# Patient Record
Sex: Female | Born: 1970 | Race: Black or African American | Hispanic: No | Marital: Married | Smoking: Never smoker
Health system: Southern US, Community
[De-identification: ages and names within clinical notes are randomized; demographics above are authoritative.]

## PROBLEM LIST (undated history)

## (undated) DIAGNOSIS — I10 Essential (primary) hypertension: Secondary | ICD-10-CM

## (undated) DIAGNOSIS — M5416 Radiculopathy, lumbar region: Secondary | ICD-10-CM

## (undated) HISTORY — PX: TONSILLECTOMY: SUR1361

## (undated) HISTORY — PX: ABDOMINAL HYSTERECTOMY: SHX81

---

## 2009-02-09 ENCOUNTER — Emergency Department (HOSPITAL_BASED_OUTPATIENT_CLINIC_OR_DEPARTMENT_OTHER): Admission: EM | Admit: 2009-02-09 | Discharge: 2009-02-09 | Payer: Self-pay | Admitting: Emergency Medicine

## 2009-02-09 ENCOUNTER — Ambulatory Visit: Payer: Self-pay | Admitting: Diagnostic Radiology

## 2009-07-25 ENCOUNTER — Emergency Department (HOSPITAL_BASED_OUTPATIENT_CLINIC_OR_DEPARTMENT_OTHER): Admission: EM | Admit: 2009-07-25 | Discharge: 2009-07-25 | Payer: Self-pay | Admitting: Emergency Medicine

## 2009-07-25 ENCOUNTER — Ambulatory Visit: Payer: Self-pay | Admitting: Radiology

## 2009-11-29 ENCOUNTER — Emergency Department (HOSPITAL_BASED_OUTPATIENT_CLINIC_OR_DEPARTMENT_OTHER): Admission: EM | Admit: 2009-11-29 | Discharge: 2009-11-29 | Payer: Self-pay | Admitting: Emergency Medicine

## 2010-07-29 LAB — DIFFERENTIAL
Basophils Absolute: 0.2 10*3/uL — ABNORMAL HIGH (ref 0.0–0.1)
Basophils Relative: 3 % — ABNORMAL HIGH (ref 0–1)
Lymphs Abs: 2 10*3/uL (ref 0.7–4.0)
Monocytes Relative: 6 % (ref 3–12)
Neutro Abs: 3.9 10*3/uL (ref 1.7–7.7)
Neutrophils Relative %: 60 % (ref 43–77)

## 2010-07-29 LAB — BASIC METABOLIC PANEL
BUN: 7 mg/dL (ref 6–23)
Calcium: 8.9 mg/dL (ref 8.4–10.5)
Creatinine, Ser: 0.7 mg/dL (ref 0.4–1.2)

## 2010-07-29 LAB — CBC
HCT: 37.3 % (ref 36.0–46.0)
MCV: 88.2 fL (ref 78.0–100.0)
RBC: 4.23 MIL/uL (ref 3.87–5.11)
WBC: 6.6 10*3/uL (ref 4.0–10.5)

## 2010-07-29 LAB — URINALYSIS, ROUTINE W REFLEX MICROSCOPIC
Bilirubin Urine: NEGATIVE
Glucose, UA: NEGATIVE mg/dL
Hgb urine dipstick: NEGATIVE
pH: 7 (ref 5.0–8.0)

## 2010-08-08 LAB — DIFFERENTIAL
Basophils Relative: 2 % — ABNORMAL HIGH (ref 0–1)
Eosinophils Relative: 1 % (ref 0–5)
Lymphocytes Relative: 43 % (ref 12–46)
Monocytes Absolute: 0.4 10*3/uL (ref 0.1–1.0)
Monocytes Relative: 5 % (ref 3–12)

## 2010-08-08 LAB — COMPREHENSIVE METABOLIC PANEL
AST: 26 U/L (ref 0–37)
Albumin: 4.5 g/dL (ref 3.5–5.2)
CO2: 28 mEq/L (ref 19–32)
Calcium: 9.2 mg/dL (ref 8.4–10.5)
Chloride: 105 mEq/L (ref 96–112)
GFR calc non Af Amer: >60 mL/min (ref >60)
Glucose, Bld: 93 mg/dL (ref 70–99)
Potassium: 3.9 mEq/L (ref 3.5–5.1)
Sodium: 141 mEq/L (ref 135–145)
Total Bilirubin: 0.4 mg/dL (ref 0.3–1.2)

## 2010-08-08 LAB — URINALYSIS, ROUTINE W REFLEX MICROSCOPIC
Bilirubin Urine: NEGATIVE
Ketones, ur: NEGATIVE mg/dL
pH: 7 (ref 5.0–8.0)

## 2010-08-08 LAB — PREGNANCY, URINE: Preg Test, Ur: NEGATIVE

## 2010-08-08 LAB — CBC: Platelets: 236 10*3/uL (ref 150–400)

## 2010-12-15 ENCOUNTER — Emergency Department (HOSPITAL_BASED_OUTPATIENT_CLINIC_OR_DEPARTMENT_OTHER)
Admission: EM | Admit: 2010-12-15 | Discharge: 2010-12-15 | Disposition: A | Discharge status: Home / Self Care | Payer: BC Managed Care – PPO | Attending: Emergency Medicine | Admitting: Emergency Medicine

## 2010-12-15 ENCOUNTER — Encounter: Payer: Self-pay | Admitting: Emergency Medicine

## 2010-12-15 ENCOUNTER — Emergency Department (INDEPENDENT_AMBULATORY_CARE_PROVIDER_SITE_OTHER): Payer: BC Managed Care – PPO

## 2010-12-15 DIAGNOSIS — M7710 Lateral epicondylitis, unspecified elbow: Secondary | ICD-10-CM | POA: Insufficient documentation

## 2010-12-15 DIAGNOSIS — M25529 Pain in unspecified elbow: Secondary | ICD-10-CM

## 2010-12-15 DIAGNOSIS — M7989 Other specified soft tissue disorders: Secondary | ICD-10-CM

## 2010-12-15 DIAGNOSIS — M25429 Effusion, unspecified elbow: Secondary | ICD-10-CM | POA: Insufficient documentation

## 2010-12-15 MED ORDER — CELECOXIB 400 MG PO CAPS: 400.0000 mg | ORAL_CAPSULE | Freq: Two times a day (BID) | ORAL | Status: AC

## 2010-12-15 MED ORDER — HYDROCODONE-ACETAMINOPHEN 5-325 MG PO TABS: 1.0000 | ORAL_TABLET | ORAL | Status: AC | PRN

## 2010-12-15 NOTE — ED Notes (Addendum)
Right elbow pain with swelling since yesterday.  No known injury.  Pt also states she has a knot on her elbow.  Noted soft tissue swelling under skin which is movable.  Right elbow significantly swollen and painful.  Pt denies fever, but states she has been really tired.

## 2010-12-15 NOTE — ED Provider Notes (Signed)
History     CSN: [id number] Arrival date & time: 12/15/2010  1:41 PM  Chief Complaint  Patient presents with  . Joint Pain  . Joint Swelling   The history is provided by the patient.  She had onset yesterday of severe, sharp pains in the right elbow, worse with movement. Pain is rated at 9/10. No numbness, no weakness. She has tried ice and Ibuprofen with no relief. She works at a [employer] with repetitive motion, and has a history of "Tennis Elbow".  Past Medical History  Diagnosis Date  . Migraine     Past Surgical History  Procedure Date  . Abdominal hysterectomy   . Tonsillectomy     History reviewed. No pertinent family history.  History  Substance Use Topics  . Smoking status: Never Smoker   . Smokeless tobacco: Not on file  . Alcohol Use: No    OB History    Grav Para Term Preterm Abortions TAB SAB Ect Mult Living                  Review of Systems  All other systems reviewed and are negative.    Physical Exam  BP 142/88  Pulse 69  Temp(Src) 98.6 F (37 C) (Oral)  Resp 18  SpO2 100%  Physical Exam  Nursing note and vitals reviewed. Constitutional: She is oriented to person, place, and time. She appears well-developed and well-nourished. No distress.  HENT:  Head: Normocephalic and atraumatic.  Right Ear: External ear normal.  Left Ear: External ear normal.  Nose: Nose normal.  Mouth/Throat: Oropharynx is clear and moist.  Eyes: Conjunctivae and EOM are normal. Pupils are equal, round, and reactive to light. No scleral icterus.  Neck: Normal range of motion. Neck supple. No JVD present.  Cardiovascular: Normal rate, regular rhythm and intact distal pulses.   No murmur heard. Pulmonary/Chest: Effort normal and breath sounds normal. She has no wheezes. She has no rales. She exhibits no tenderness.  Abdominal: Soft. Bowel sounds are normal. She exhibits no mass. There is no tenderness.  Musculoskeletal: Normal range of motion. She exhibits no  edema.       Moderate tenderness of the right elbow at the olecranon and lateral epicondyle. There is pain with passive ROM, but full passive ROM is present. Neurovascular is intact. No swelling, warmth or deformity.  Lymphadenopathy:    She has no cervical adenopathy.  Neurological: She is alert and oriented to person, place, and time. No cranial nerve deficit. Coordination normal.  Skin: Skin is warm and dry. No rash noted.  Psychiatric: She has a normal mood and affect.    ED Course  Procedures  MDM Probable lateral epicondylitis.  She states she has difficulty with NSAID's because of GERD. Will try Celebrex. Labs and x-rays reviewed, images viewed by me. Results for orders placed during the hospital encounter of 07/25/09  URINALYSIS, ROUTINE W REFLEX MICROSCOPIC      Component Value Range   Color, Urine YELLOW  YELLOW    Appearance CLOUDY (*) CLEAR    Specific Gravity, Urine 1.017  1.005 - 1.030    pH 7.0  5.0 - 8.0    Glucose, UA NEGATIVE  NEGATIVE (mg/dL)   Hgb urine dipstick NEGATIVE  NEGATIVE    Bilirubin Urine NEGATIVE  NEGATIVE    Ketones, ur NEGATIVE  NEGATIVE (mg/dL)   Protein, ur NEGATIVE  NEGATIVE (mg/dL)   Urobilinogen, UA 0.2  0.0 - 1.0 (mg/dL)   Nitrite NEGATIVE  NEGATIVE    Leukocytes, UA    NEGATIVE    Value: NEGATIVE MICROSCOPIC NOT DONE ON URINES WITH NEGATIVE PROTEIN, BLOOD, LEUKOCYTES, NITRITE, OR GLUCOSE <1000 mg/dL.  BASIC METABOLIC PANEL      Component Value Range   Sodium 141  135 - 145 (mEq/L)   Potassium 4.8  3.5 - 5.1 (mEq/L)   Chloride 106  96 - 112 (mEq/L)   CO2 25  19 - 32 (mEq/L)   Glucose, Bld 87  70 - 99 (mg/dL)   BUN 7  6 - 23 (mg/dL)   Creatinine, Ser .7  0.4 - 1.2 (mg/dL)   Calcium 8.9  8.4 - 40.9 (mg/dL)   GFR calc non Af Amer >60  >60 (mL/min)   GFR calc Af Amer    >60 (mL/min)   Value: >60            The eGFR has been calculated     using the MDRD equation.     This calculation has not been     validated in all clinical      situations.     eGFR's persistently     <60 mL/min signify     possible Chronic Kidney Disease.  CBC      Component Value Range   WBC 6.6  4.0 - 10.5 (K/uL)   RBC 4.23  3.87 - 5.11 (MIL/uL)   Hemoglobin 12.6  12.0 - 15.0 (g/dL)   HCT 81.1  91.4 - 78.2 (%)   MCV 88.2  78.0 - 100.0 (fL)   MCHC 33.8  30.0 - 36.0 (g/dL)   RDW 95.6  21.3 - 08.6 (%)   Platelets 219  150 - 400 (K/uL)  DIFFERENTIAL      Component Value Range   Neutrophils Relative 60  43 - 77 (%)   Neutro Abs 3.9  1.7 - 7.7 (K/uL)   Lymphocytes Relative 31  12 - 46 (%)   Lymphs Abs 2.0  0.7 - 4.0 (K/uL)   Monocytes Relative 6  3 - 12 (%)   Monocytes Absolute 0.4  0.1 - 1.0 (K/uL)   Eosinophils Relative 1  0 - 5 (%)   Eosinophils Absolute 0.1  0.0 - 0.7 (K/uL)   Basophils Relative 3 (*) 0 - 1 (%)   Basophils Absolute 0.2 (*) 0.0 - 0.1 (K/uL)   Dg Elbow Complete Right  12/15/2010  *RADIOLOGY REPORT*  Clinical Data: Pain and swelling without trauma  RIGHT ELBOW - COMPLETE 3+ VIEW  Comparison: None.  Findings: No effusion. Negative for fracture, dislocation, or other acute abnormality.  Normal alignment and mineralization. No significant degenerative change.  Regional soft tissues unremarkable.  IMPRESSION:  Negative  Original Report Authenticated By: Osa Craver, M.D.      Dione Booze, MD 12/15/10 [phone]

## 2010-12-16 ENCOUNTER — Encounter: Payer: Self-pay | Admitting: Family Medicine

## 2010-12-16 ENCOUNTER — Ambulatory Visit (INDEPENDENT_AMBULATORY_CARE_PROVIDER_SITE_OTHER): Payer: BC Managed Care – PPO | Admitting: Family Medicine

## 2010-12-16 VITALS — BP 134/89 | HR 71 | Temp 98.1°F | Ht 61.0 in | Wt 162.8 lb

## 2010-12-16 DIAGNOSIS — M25521 Pain in right elbow: Secondary | ICD-10-CM

## 2010-12-16 DIAGNOSIS — M25529 Pain in unspecified elbow: Secondary | ICD-10-CM

## 2010-12-16 NOTE — Patient Instructions (Signed)
You have lateral epicondylitis Try to avoid painful activities as much as possible. Wear the cock-up wrist splint as much as possible also - can take off to shower, ice, do exercises. Ice the area 3-4 times a day for 15 minutes at a time. Celecoxib daily for pain and inflammation. Ok for you to take the hydrocodone the emergency department gave you at bedtime - no driving on this medicine. Start physical therapy to learn home exercises and stretches and do these every day. Consider formal PT. Consider injection for short term pain relief if the above is not helping. Surgery is a consideration if exercises and injection are not providing enough relief. Follow up with me in 1 month for a recheck.

## 2010-12-17 ENCOUNTER — Encounter: Payer: Self-pay | Admitting: Family Medicine

## 2010-12-17 DIAGNOSIS — M25521 Pain in right elbow: Secondary | ICD-10-CM | POA: Insufficient documentation

## 2010-12-17 NOTE — Assessment & Plan Note (Signed)
Right lateral epicondylitis - counterforce brace causing more pain and was placed in proper location - discontinue this.  NSAIDs, icing, start physical therapy and home exercises.  Cock-up wrist brace to prevent extension of wrist which contributes to overuse at elbow.  Offered injection but she declined - would like to try PT first for next month.  She will return to work next Monday.  May have an element of radial tunnel as well with this.  Her pain at triceps and olecranon may be the result of overuse of elbow extension with pain in this location as well.

## 2010-12-17 NOTE — Progress Notes (Signed)
Subjective:    Patient ID: Nina Patel, female    DOB: [birth year], 40 y.o.   MRN: [id number]  [facility]P: [facility]  HPI 40 yo F here for right arm pain.  Patient reports no known injury. She works at a job folding socks, lots of manual dexterity involved. While working started to develop pain on outside of right elbow. No swelling or bruising. Had similar problem about 2 months ago that seemed to get better - was diagnosed with tennis elbow. Has been given a counterforce brace but this makes it hurt worse Has not tried PT or any home exercises. Is icing and taking motrin. Hard to straighten elbow out due to pain. Some numbness intermittently into tips of 3rd-5th fingers. Has been written out of work by ED through next Monday. Right handed  Past Medical History  Diagnosis Date  . Migraine     Current Outpatient Prescriptions on File Prior to Visit  Medication Sig Dispense Refill  . celecoxib (CELEBREX) 400 MG capsule Take 1 capsule (400 mg total) by mouth 2 (two) times daily.  30 capsule  0  . HYDROcodone-acetaminophen (NORCO) 5-325 MG per tablet Take 1 tablet by mouth every 4 (four) hours as needed for pain.  20 tablet  0    Past Surgical History  Procedure Date  . Abdominal hysterectomy   . Tonsillectomy     No Known Allergies  History   Social History  . Marital Status: Married    Spouse Name: N/A    Number of Children: N/A  . Years of Education: N/A   Occupational History  . Not on file.   Social History Main Topics  . Smoking status: Never Smoker   . Smokeless tobacco: Not on file  . Alcohol Use: No  . Drug Use: No  . Sexually Active:    Other Topics Concern  . Not on file   Social History Narrative  . No narrative on file    Family History  Problem Relation Age of Onset  . Diabetes Mother   . Diabetes Sister   . Hypertension Brother   . Diabetes Maternal Uncle   . Hypertension Maternal Uncle   . Sudden death Paternal Aunt   .  Diabetes Maternal Grandfather   . Heart attack Paternal Grandfather   . Hyperlipidemia Neg Hx     BP 134/89  Pulse 71  Temp(Src) 98.1 F (36.7 C) (Oral)  Ht 5\' 1"  (1.549 m)  Wt 162 lb 12.8 oz (73.846 kg)  BMI 30.76 kg/m2  Review of Systems See HPI above.    Objective:   Physical Exam Gen: NAD R elbow: No gross deformity, swelling, bruising. TTP greatest at lateral epicondyle but also within extensor mass.  Mild TTP olecranon and triceps tendon.  No medial epicondyle, wrist/hand TTP. FROM elbow and wrist. Pain reproduced on wrist extension and 3rd finger extension.  Strength diminished 2/2 pain. Collateral ligaments intact NVI distally. Sensation currently intact.    Assessment & Plan:  1. Right lateral epicondylitis - counterforce brace causing more pain and was placed in proper location - discontinue this.  NSAIDs, icing, start physical therapy and home exercises.  Cock-up wrist brace to prevent extension of wrist which contributes to overuse at elbow.  Offered injection but she declined - would like to try PT first for next month.  She will return to work next Monday.  May have an element of radial tunnel as well with this.  Her pain at triceps and  olecranon may be the result of overuse of elbow extension with pain in this location as well.

## 2010-12-23 ENCOUNTER — Ambulatory Visit: Payer: BC Managed Care – PPO | Attending: Family Medicine | Admitting: Physical Therapy

## 2010-12-26 ENCOUNTER — Encounter: Payer: Self-pay | Admitting: Family Medicine

## 2010-12-26 ENCOUNTER — Ambulatory Visit (INDEPENDENT_AMBULATORY_CARE_PROVIDER_SITE_OTHER): Payer: BC Managed Care – PPO | Admitting: Family Medicine

## 2010-12-26 VITALS — BP 110/75 | HR 77 | Temp 98.3°F | Ht 61.0 in | Wt 162.0 lb

## 2010-12-26 DIAGNOSIS — M25521 Pain in right elbow: Secondary | ICD-10-CM

## 2010-12-26 DIAGNOSIS — M25529 Pain in unspecified elbow: Secondary | ICD-10-CM

## 2010-12-26 NOTE — Assessment & Plan Note (Signed)
Right lateral epicondylitis - exam is consistent with lateral epicondylitis.  Has not yet started PT so not surprising she has not noticed much improvement over past 10 days.  Continue wearing her wrist brace, icing, will try aleve instead of celebrex and be sure to take with food.  Start PT and home exercises - stressed she needs to do these every day.  She would like to hold off on cortisone injection and give PT a try first which I believe is the right course.  If not improving at follow-up in 1 month would go ahead with injection though.  Place on light duty in a job where she doesn't have to do repetitive wrist motions (especially extension) that will further exacerbate this.

## 2010-12-26 NOTE — Progress Notes (Signed)
Subjective:    Patient ID: Nina Patel, female    DOB: 1970-09-18, 40 y.o.   MRN: [id number]  PCP: [facility]  HPI  40 yo F here for f/u right lateral epicondylitis.  8/13: Patient reports no known injury. She works at a job folding socks, lots of manual dexterity involved. While working started to develop pain on outside of right elbow. No swelling or bruising. Had similar problem about 2 months ago that seemed to get better - was diagnosed with tennis elbow. Has been given a counterforce brace but this makes it hurt worse Has not tried PT or any home exercises. Is icing and taking motrin. Hard to straighten elbow out due to pain. Some numbness intermittently into tips of 3rd-5th fingers. Has been written out of work by ED through next Monday. Right handed  8/23: Patient has been wearing wrist splint, icing, but has not yet started PT (due to start next Thursday). She takes celebrex also but this makes her nauseated. Back at work wearing splint also but had to leave early yesterday due to pain. Majority of pain is at lateral right elbow, now getting some in dorsal wrist with extension of wrist. There is light duty available to her at work.  Past Medical History  Diagnosis Date  . Migraine     Current Outpatient Prescriptions on File Prior to Visit  Medication Sig Dispense Refill  . celecoxib (CELEBREX) 400 MG capsule Take 1 capsule (400 mg total) by mouth 2 (two) times daily.  30 capsule  0  . HYDROcodone-acetaminophen (NORCO) 5-325 MG per tablet Take 1 tablet by mouth every 4 (four) hours as needed for pain.  20 tablet  0    Past Surgical History  Procedure Date  . Abdominal hysterectomy   . Tonsillectomy     No Known Allergies  History   Social History  . Marital Status: Married    Spouse Name: N/A    Number of Children: N/A  . Years of Education: N/A   Occupational History  . Not on file.   Social History Main Topics  . Smoking status:  Never Smoker   . Smokeless tobacco: Not on file  . Alcohol Use: No  . Drug Use: No  . Sexually Active: Not on file   Other Topics Concern  . Not on file   Social History Narrative  . No narrative on file    Family History  Problem Relation Age of Onset  . Diabetes Mother   . Diabetes Sister   . Hypertension Brother   . Diabetes Maternal Uncle   . Hypertension Maternal Uncle   . Sudden death Paternal Aunt   . Diabetes Maternal Grandfather   . Heart attack Paternal Grandfather   . Hyperlipidemia Neg Hx     BP 110/75  Pulse 77  Temp(Src) 98.3 F (36.8 C) (Oral)  Ht 5\' 1"  (1.549 m)  Wt 162 lb (73.483 kg)  BMI 30.61 kg/m2  Review of Systems  See HPI above.    Objective:   Physical Exam  Gen: NAD R elbow: No gross deformity, swelling, bruising. TTP greatest at lateral epicondyle and within extensor mass.  No longer with TTP olecranon or triceps tendon.  No medial epicondyle, wrist/hand TTP. FROM elbow and wrist. Pain reproduced on wrist extension and 3rd finger extension (both at elbow and dorsal wrist).  Strength diminished 2/2 pain. Collateral ligaments intact NVI distally. Sensation currently intact.    Assessment & Plan:  1. Right  lateral epicondylitis - exam is consistent with lateral epicondylitis.  Has not yet started PT so not surprising she has not noticed much improvement over past 10 days.  Continue wearing her wrist brace, icing, will try aleve instead of celebrex and be sure to take with food.  Start PT and home exercises - stressed she needs to do these every day.  She would like to hold off on cortisone injection and give PT a try first which I believe is the right course.  If not improving at follow-up in 1 month would go ahead with injection though.  Place on light duty in a job where she doesn't have to do repetitive wrist motions (especially extension) that will further exacerbate this.

## 2010-12-26 NOTE — Patient Instructions (Addendum)
You have lateral epicondylitis Try to avoid painful activities as much as possible. Wear the cock-up wrist splint as much as possible also, especially at work. Ice the area 3-4 times a day for 15 minutes at a time. Try aleve 2 tabs twice a day with food for pain and inflammation. Start physical therapy to learn home exercises and stretches and do these every day - this is the most important part of your treatment. Consider injection for short term pain relief if the above is not helping. Surgery is a consideration if exercises and injection are not providing enough relief. Follow up with me in 1 month for a recheck - if not getting better we will go ahead with a cortisone injection.

## 2011-01-02 ENCOUNTER — Ambulatory Visit: Payer: BC Managed Care – PPO | Admitting: Physical Therapy

## 2011-01-03 ENCOUNTER — Ambulatory Visit: Payer: BC Managed Care – PPO | Admitting: Family Medicine

## 2011-01-07 ENCOUNTER — Encounter: Payer: Self-pay | Admitting: Family Medicine

## 2011-01-07 ENCOUNTER — Ambulatory Visit: Payer: BC Managed Care – PPO | Admitting: Family Medicine

## 2011-01-16 ENCOUNTER — Ambulatory Visit: Payer: BC Managed Care – PPO | Admitting: Family Medicine

## 2011-01-24 ENCOUNTER — Encounter: Payer: Self-pay | Admitting: Family Medicine

## 2011-01-24 ENCOUNTER — Ambulatory Visit (INDEPENDENT_AMBULATORY_CARE_PROVIDER_SITE_OTHER): Payer: BC Managed Care – PPO | Admitting: Family Medicine

## 2011-01-24 VITALS — BP 123/84 | HR 73 | Temp 97.9°F | Ht 61.0 in | Wt 160.0 lb

## 2011-01-24 DIAGNOSIS — M25521 Pain in right elbow: Secondary | ICD-10-CM

## 2011-01-24 DIAGNOSIS — M25529 Pain in unspecified elbow: Secondary | ICD-10-CM

## 2011-01-24 NOTE — Progress Notes (Signed)
Subjective:    Patient ID: Nina Patel, female    DOB: March 21, 1971, 40 y.o.   MRN: [id number]  PCP: [facility]  HPI  40 yo F here for f/u right lateral epicondylitis.  8/13: Patient reports no known injury. She works at a job folding socks, lots of manual dexterity involved. While working started to develop pain on outside of right elbow. No swelling or bruising. Had similar problem about 2 months ago that seemed to get better - was diagnosed with tennis elbow. Has been given a counterforce brace but this makes it hurt worse Has not tried PT or any home exercises. Is icing and taking motrin. Hard to straighten elbow out due to pain. Some numbness intermittently into tips of 3rd-5th fingers. Has been written out of work by ED through next Monday. Right handed  8/23: Patient has been wearing wrist splint, icing, but has not yet started PT (due to start next Thursday). She takes celebrex also but this makes her nauseated. Back at work wearing splint also but had to leave early yesterday due to pain. Majority of pain is at lateral right elbow, now getting some in dorsal wrist with extension of wrist. There is light duty available to her at work.  9/21: Patient was unable to afford PT. Has been using wrist splint at work - working 8 hour days without restrictions Icing, aleve as well. Pain is slightly improved compared to last visit.  Worse at end of day and end of week.  Past Medical History  Diagnosis Date  . Migraine     No current outpatient prescriptions on file prior to visit.    Past Surgical History  Procedure Date  . Abdominal hysterectomy   . Tonsillectomy     No Known Allergies  History   Social History  . Marital Status: Married    Spouse Name: N/A    Number of Children: N/A  . Years of Education: N/A   Occupational History  . Not on file.   Social History Main Topics  . Smoking status: Never Smoker   . Smokeless tobacco: Not on  file  . Alcohol Use: No  . Drug Use: No  . Sexually Active: Not on file   Other Topics Concern  . Not on file   Social History Narrative  . No narrative on file    Family History  Problem Relation Age of Onset  . Diabetes Mother   . Diabetes Sister   . Hypertension Brother   . Diabetes Maternal Uncle   . Hypertension Maternal Uncle   . Sudden death Paternal Aunt   . Diabetes Maternal Grandfather   . Heart attack Paternal Grandfather   . Hyperlipidemia Neg Hx     BP 123/84  Pulse 73  Temp(Src) 97.9 F (36.6 C) (Oral)  Ht 5\' 1"  (1.549 m)  Wt 160 lb (72.576 kg)  BMI 30.23 kg/m2  Review of Systems  See HPI above.    Objective:   Physical Exam  Gen: NAD R elbow: No gross deformity, swelling, bruising. Mild - mod TTP lateral epicondyle and within extensor mass.  No TTP olecranon or triceps tendon.  No medial epicondyle, wrist/hand TTP. FROM elbow and wrist. Mild pain on wrist extension but no pain with 3rd finger extension.  Strength diminished 2/2 pain. Collateral ligaments intact NVI distally. Sensation currently intact.    Assessment & Plan:  1. Right lateral epicondylitis - Has not started PT due to finances and not doing home  exercises.  Advised her to call if this happens and I could show her a more simple program.  Will start that today with pronation/supination, wrist extension exercises with light weights and wrist stretches.  Continue wrist brace, icing, aleve prn.  Given she has not tried exercises or PT, want her to try these first for at least 1 month before considering injection.

## 2011-01-24 NOTE — Assessment & Plan Note (Signed)
Has not started PT due to finances and not doing home exercises.  Advised her to call if this happens and I could show her a more simple program.  Will start that today with pronation/supination, wrist extension exercises with light weights and wrist stretches.  Continue wrist brace, icing, aleve prn.  Given she has not tried exercises or PT, want her to try these first for at least 1 month before considering injection.

## 2011-01-24 NOTE — Patient Instructions (Signed)
You have lateral epicondylitis Try to avoid painful activities as much as possible. Wear the cock-up wrist splint as much as possible also, especially at work. Ice the area 3-4 times a day for 15 minutes at a time. Continue aleve 2 tabs twice a day with food for pain and inflammation. Home exercises as demonstrated - stretch in flexion and extension - hold for 20 seconds and do this 2 times, once a day. Soup can or hammer rotation in and out (pronation/supination) 3 sets of 10 once a day. Wrist extension with soup can or 1 pound weight - 3 sets of 10 once a day. Consider injection for short term pain relief if the above is not helping. Surgery is a consideration if exercises and injection are not providing enough relief. Follow up with me in 1 month for a recheck - if not getting better we will go ahead with a cortisone injection.

## 2011-02-06 ENCOUNTER — Encounter: Payer: Self-pay | Admitting: Family Medicine

## 2011-02-06 ENCOUNTER — Ambulatory Visit (INDEPENDENT_AMBULATORY_CARE_PROVIDER_SITE_OTHER): Payer: BC Managed Care – PPO | Admitting: Family Medicine

## 2011-02-06 VITALS — BP 118/77 | HR 76 | Temp 98.0°F | Ht 61.0 in | Wt 160.0 lb

## 2011-02-06 DIAGNOSIS — M25529 Pain in unspecified elbow: Secondary | ICD-10-CM

## 2011-02-06 DIAGNOSIS — M25521 Pain in right elbow: Secondary | ICD-10-CM

## 2011-02-06 NOTE — Patient Instructions (Signed)
You have lateral epicondylitis Wear the cock-up wrist splint as much as possible, especially at work. Ice the area 3-4 times a day for 15 minutes at a time. Continue aleve 2 tabs twice a day with food for pain and inflammation. Restart home exercises as demonstrated on Monday - stretch in flexion and extension - hold for 20 seconds and do this 2 times, once a day. Soup can or hammer rotation in and out (pronation/supination) 3 sets of 10 once a day. Wrist extension with soup can or 1 pound weight - 3 sets of 10 once a day. Injection given today to help with pain and inflammation. Surgery is a consideration if exercises and injection are not providing enough relief. Follow up with me in 1 month for a recheck - if not getting better we will refer you to a surgeon to discuss operative intervention (sometimes they will do an MRI prior to surgery).

## 2011-02-06 NOTE — Progress Notes (Signed)
Subjective:    Patient ID: Nina Patel, female    DOB: 01/05/1971, 40 y.o.   MRN: [id number]  PCP: [facility]  HPI  40 yo F here for f/u right lateral epicondylitis.  8/13: Patient reports no known injury. She works at a job folding socks, lots of manual dexterity involved. While working started to develop pain on outside of right elbow. No swelling or bruising. Had similar problem about 2 months ago that seemed to get better - was diagnosed with tennis elbow. Has been given a counterforce brace but this makes it hurt worse Has not tried PT or any home exercises. Is icing and taking motrin. Hard to straighten elbow out due to pain. Some numbness intermittently into tips of 3rd-5th fingers. Has been written out of work by ED through next Monday. Right handed  8/23: Patient has been wearing wrist splint, icing, but has not yet started PT (due to start next Thursday). She takes celebrex also but this makes her nauseated. Back at work wearing splint also but had to leave early yesterday due to pain. Majority of pain is at lateral right elbow, now getting some in dorsal wrist with extension of wrist. There is light duty available to her at work.  9/21: Patient was unable to afford PT. Has been using wrist splint at work - working 8 hour days without restrictions Icing, aleve as well. Pain is slightly improved compared to last visit.  Worse at end of day and end of week.  10/4: Patient still with significant pain lateral elbow especially bad when she woke up Tuesday. Reports she had swelling lateral elbow over area of pain. Has been icing, taking advil. Unable to do home exercises since Tuesday. Has been wearing her wrist splint.  Past Medical History  Diagnosis Date  . Migraine     No current outpatient prescriptions on file prior to visit.    Past Surgical History  Procedure Date  . Abdominal hysterectomy   . Tonsillectomy     No Known  Allergies  History   Social History  . Marital Status: Married    Spouse Name: N/A    Number of Children: N/A  . Years of Education: N/A   Occupational History  . Not on file.   Social History Main Topics  . Smoking status: Never Smoker   . Smokeless tobacco: Not on file  . Alcohol Use: No  . Drug Use: No  . Sexually Active: Not on file   Other Topics Concern  . Not on file   Social History Narrative  . No narrative on file    Family History  Problem Relation Age of Onset  . Diabetes Mother   . Diabetes Sister   . Hypertension Brother   . Diabetes Maternal Uncle   . Hypertension Maternal Uncle   . Sudden death Paternal Aunt   . Diabetes Maternal Grandfather   . Heart attack Paternal Grandfather   . Hyperlipidemia Neg Hx     BP 118/77  Pulse 76  Temp(Src) 98 F (36.7 C) (Oral)  Ht 5\' 1"  (1.549 m)  Wt 160 lb (72.576 kg)  BMI 30.23 kg/m2  Review of Systems  See HPI above.    Objective:   Physical Exam  Gen: NAD R elbow: No gross deformity, swelling, bruising, masses. Mod TTP lateral epicondyle and within extensor mass.  No TTP olecranon or triceps tendon.  No medial epicondyle, wrist/hand TTP. FROM elbow and wrist. Mild pain on wrist extension.  Collateral ligaments intact NVI distally. Sensation currently intact.    Assessment & Plan:  1. Right lateral epicondylitis - Cortisone injection given today - continue home exercises, icing, nsaids.  Out of work until Monday for relative rest.  If not improving as expected over next 3-4 weeks would consider referral to orthopedic surgeon for possible surgical debridement.  Call with any questions.  After informed written consent patient was lying supine on exam table.  Area overlying right lateral epicondyle was prepped with alcohol swab.  Area of maximum tenderness identified just distal to this and then injected with 2:1 marcaine: depomedrol with 5 redirections of needle - aspirating prior to each injection.   Patient tolerated procedure well without any immediate complications.

## 2011-02-06 NOTE — Assessment & Plan Note (Signed)
Cortisone injection given today - continue home exercises, icing, nsaids.  Out of work until Monday for relative rest.  If not improving as expected over next 3-4 weeks would consider referral to orthopedic surgeon for possible surgical debridement.  Call with any questions.  After informed written consent patient was lying supine on exam table.  Area overlying right lateral epicondyle was prepped with alcohol swab.  Area of maximum tenderness identified just distal to this and then injected with 2:1 marcaine: depomedrol with 5 redirections of needle - aspirating prior to each injection.  Patient tolerated procedure well without any immediate complications.

## 2011-02-24 ENCOUNTER — Ambulatory Visit: Payer: BC Managed Care – PPO | Admitting: Family Medicine

## 2011-03-07 ENCOUNTER — Encounter: Payer: Self-pay | Admitting: Family Medicine

## 2011-04-30 ENCOUNTER — Ambulatory Visit: Payer: BC Managed Care – PPO | Admitting: Family Medicine

## 2011-06-17 ENCOUNTER — Encounter: Payer: Self-pay | Admitting: Family Medicine

## 2011-06-17 ENCOUNTER — Ambulatory Visit (INDEPENDENT_AMBULATORY_CARE_PROVIDER_SITE_OTHER): Payer: BC Managed Care – PPO | Admitting: Family Medicine

## 2011-06-17 VITALS — BP 146/91 | HR 75 | Temp 98.3°F | Ht 61.0 in | Wt 160.0 lb

## 2011-06-17 DIAGNOSIS — M25521 Pain in right elbow: Secondary | ICD-10-CM

## 2011-06-17 DIAGNOSIS — M25529 Pain in unspecified elbow: Secondary | ICD-10-CM

## 2011-06-17 NOTE — Progress Notes (Addendum)
Subjective:    Patient ID: Nina Patel, female    DOB: 1970-12-11, 41 y.o.   MRN: [id number]  PCP: [facility]  HPI  41 yo F here for f/u right lateral epicondylitis.  8/13: Patient reports no known injury. She works at a job folding socks, lots of manual dexterity involved. While working started to develop pain on outside of right elbow. No swelling or bruising. Had similar problem about 2 months ago that seemed to get better - was diagnosed with tennis elbow. Has been given a counterforce brace but this makes it hurt worse Has not tried PT or any home exercises. Is icing and taking motrin. Hard to straighten elbow out due to pain. Some numbness intermittently into tips of 3rd-5th fingers. Has been written out of work by ED through next Monday. Right handed  8/23: Patient has been wearing wrist splint, icing, but has not yet started PT (due to start next Thursday). She takes celebrex also but this makes her nauseated. Back at work wearing splint also but had to leave early yesterday due to pain. Majority of pain is at lateral right elbow, now getting some in dorsal wrist with extension of wrist. There is light duty available to her at work.  9/21: Patient was unable to afford PT. Has been using wrist splint at work - working 8 hour days without restrictions Icing, aleve as well. Pain is slightly improved compared to last visit.  Worse at end of day and end of week.  10/4: Patient still with significant pain lateral elbow especially bad when she woke up Tuesday. Reports she had swelling lateral elbow over area of pain. Has been icing, taking advil. Unable to do home exercises since Tuesday. Has been wearing her wrist splint.  06/17/11: Patient reports injection helped but pain has started to recur over past few weeks - felt much better though after injection. Still using wrist splint, doing home exercises, and taking advil. Pain still lateral right elbow  with radiation into forearm.  Numbness in this area as well (this restarted past 3 days into ulnar fingers).  Past Medical History  Diagnosis Date  . Migraine     No current outpatient prescriptions on file prior to visit.    Past Surgical History  Procedure Date  . Abdominal hysterectomy   . Tonsillectomy     No Known Allergies  History   Social History  . Marital Status: Married    Spouse Name: N/A    Number of Children: N/A  . Years of Education: N/A   Occupational History  . Not on file.   Social History Main Topics  . Smoking status: Never Smoker   . Smokeless tobacco: Not on file  . Alcohol Use: No  . Drug Use: No  . Sexually Active: Not on file   Other Topics Concern  . Not on file   Social History Narrative  . No narrative on file    Family History  Problem Relation Age of Onset  . Diabetes Mother   . Diabetes Sister   . Hypertension Brother   . Diabetes Maternal Uncle   . Hypertension Maternal Uncle   . Sudden death Paternal Aunt   . Diabetes Maternal Grandfather   . Heart attack Paternal Grandfather   . Hyperlipidemia Neg Hx     BP 146/91  Pulse 75  Temp(Src) 98.3 F (36.8 C) (Oral)  Ht 5\' 1"  (1.549 m)  Wt 160 lb (72.576 kg)  BMI 30.23 kg/m2  Review of Systems  See HPI above.    Objective:   Physical Exam  Gen: NAD R elbow: No gross deformity, swelling, bruising, masses. Mod TTP lateral epicondyle and within extensor mass.  Mild TTP triceps tendon.  No TTP olecranon.  No medial epicondyle, wrist/hand TTP. FROM elbow and wrist. Mild pain on wrist extension and 3rd digit extension. Collateral ligaments intact NVI distally. Sensation currently intact.    Assessment & Plan:  1. Right lateral epicondylitis - Cortisone injection repeated today after discussing her options.  She will also continue home exercises, icing, nsaids.  Out of work until Monday for relative rest.  If not improvng we discussed can try nitro patches or  surgical referral.  Would not give a third injection for this condition.    After informed written consent patient was lying supine on exam table.  Area overlying right lateral epicondyle was prepped with alcohol swab.  Area of maximum tenderness identified just distal to this and then injected with 2:1 marcaine: depomedrol with 3 redirections of needle - aspirating prior to each injection.  Patient tolerated procedure well without any immediate complications.  Addendum 3/6: Patient called reporting pain has not improved despite home exercises, injections, nsaids.  She would like to try nitro patches.  Also unable to work full day due to pain - will write for patient to work half days, follow-up with me in 6 weeks for reevaluation. If not improving at that time, will refer for surgical consultation.  Addendum 3/25: Patient called stating she couldn't tolerate nitro patches due to headaches so we discussed surgical referral.  She noted her FMLA time is up for this year and she must return to work/full duty.  FMLA time resets in August per her report.

## 2011-06-17 NOTE — Patient Instructions (Signed)
You have lateral epicondylitis Wear the cock-up wrist splint as much as possible. Ice the area 3-4 times a day for 15 minutes at a time. Advil as you have been as needed. Restart home exercises on Monday - stretch in flexion and extension - hold for 20 seconds and do this 2 times, once a day. Soup can or hammer rotation in and out (pronation/supination) 3 sets of 10 once a day. Wrist extension with soup can or 1 pound weight - 3 sets of 10 once a day. Injection given today to help with pain and inflammation. Can consider nitro patches as well vs surgery if you do not improve. I would not do a third cortisone injection. Follow up with me in 1 month for a recheck - if not getting better we will refer you to a surgeon to discuss operative intervention (sometimes they will do an MRI prior to surgery).

## 2011-06-17 NOTE — Assessment & Plan Note (Signed)
Cortisone injection repeated today after discussing her options.  She will also continue home exercises, icing, nsaids.  Out of work until Monday for relative rest.  If not improvng we discussed can try nitro patches or surgical referral.  Would not give a third injection for this condition.    After informed written consent patient was lying supine on exam table.  Area overlying right lateral epicondyle was prepped with alcohol swab.  Area of maximum tenderness identified just distal to this and then injected with 2:1 marcaine: depomedrol with 3 redirections of needle - aspirating prior to each injection.  Patient tolerated procedure well without any immediate complications.

## 2011-07-09 ENCOUNTER — Encounter: Payer: Self-pay | Admitting: Family Medicine

## 2011-07-09 MED ORDER — NITROGLYCERIN 0.2 MG/HR TD PT24: MEDICATED_PATCH | TRANSDERMAL | Status: DC

## 2011-07-09 NOTE — Progress Notes (Signed)
Addended by: Lenda Kelp on: 07/09/2011 04:09 PM   Modules accepted: Orders

## 2011-07-09 NOTE — Progress Notes (Signed)
Addended by: Lenda Kelp on: 07/09/2011 09:15 AM   Modules accepted: Orders

## 2011-07-15 ENCOUNTER — Ambulatory Visit: Payer: BC Managed Care – PPO | Admitting: Family Medicine

## 2011-07-29 ENCOUNTER — Encounter: Payer: Self-pay | Admitting: Family Medicine

## 2011-09-02 ENCOUNTER — Other Ambulatory Visit: Payer: Self-pay | Admitting: Family Medicine

## 2011-09-22 ENCOUNTER — Emergency Department (HOSPITAL_BASED_OUTPATIENT_CLINIC_OR_DEPARTMENT_OTHER): Payer: BC Managed Care – PPO

## 2011-09-22 ENCOUNTER — Emergency Department (HOSPITAL_BASED_OUTPATIENT_CLINIC_OR_DEPARTMENT_OTHER)
Admission: EM | Admit: 2011-09-22 | Discharge: 2011-09-23 | Disposition: A | Discharge status: Home / Self Care | Payer: BC Managed Care – PPO | Attending: Emergency Medicine | Admitting: Emergency Medicine

## 2011-09-22 ENCOUNTER — Encounter (HOSPITAL_BASED_OUTPATIENT_CLINIC_OR_DEPARTMENT_OTHER): Payer: Self-pay | Admitting: *Deleted

## 2011-09-22 DIAGNOSIS — K2990 Gastroduodenitis, unspecified, without bleeding: Secondary | ICD-10-CM | POA: Insufficient documentation

## 2011-09-22 DIAGNOSIS — R1013 Epigastric pain: Secondary | ICD-10-CM | POA: Insufficient documentation

## 2011-09-22 DIAGNOSIS — R110 Nausea: Secondary | ICD-10-CM | POA: Insufficient documentation

## 2011-09-22 DIAGNOSIS — K2970 Gastritis, unspecified, without bleeding: Secondary | ICD-10-CM | POA: Insufficient documentation

## 2011-09-22 LAB — COMPREHENSIVE METABOLIC PANEL
AST: 13 U/L (ref 0–37)
Albumin: 4.1 g/dL (ref 3.5–5.2)
Alkaline Phosphatase: 91 U/L (ref 39–117)
BUN: 8 mg/dL (ref 6–23)
CO2: 26 mEq/L (ref 19–32)
Chloride: 104 mEq/L (ref 96–112)
Creatinine, Ser: 0.6 mg/dL (ref 0.50–1.10)
GFR calc non Af Amer: >90 mL/min (ref >90)
Glucose, Bld: 83 mg/dL (ref 70–99)
Potassium: 3.3 mEq/L — ABNORMAL LOW (ref 3.5–5.1)
Sodium: 139 mEq/L (ref 135–145)
Total Protein: 7.7 g/dL (ref 6.0–8.3)

## 2011-09-22 LAB — URINALYSIS, ROUTINE W REFLEX MICROSCOPIC
Bilirubin Urine: NEGATIVE
Ketones, ur: NEGATIVE mg/dL
Leukocytes, UA: NEGATIVE
Protein, ur: NEGATIVE mg/dL
pH: 7.5 (ref 5.0–8.0)

## 2011-09-22 LAB — DIFFERENTIAL
Basophils Absolute: 0 10*3/uL (ref 0.0–0.1)
Basophils Relative: 0 % (ref 0–1)
Neutro Abs: 3.9 10*3/uL (ref 1.7–7.7)

## 2011-09-22 LAB — CBC
Platelets: 282 10*3/uL (ref 150–400)
RBC: 4.17 MIL/uL (ref 3.87–5.11)

## 2011-09-22 MED ORDER — FENTANYL CITRATE 0.05 MG/ML IJ SOLN
100.0000 ug | Freq: Once | INTRAMUSCULAR | Status: AC
  Administered 2011-09-22: 100 ug via INTRAVENOUS
  Filled 2011-09-22: qty 2

## 2011-09-22 MED ORDER — POTASSIUM CHLORIDE CRYS ER 20 MEQ PO TBCR
40.0000 meq | EXTENDED_RELEASE_TABLET | Freq: Once | ORAL | Status: AC
  Administered 2011-09-22: 40 meq via ORAL
  Filled 2011-09-22: qty 2

## 2011-09-22 MED ORDER — ONDANSETRON HCL 4 MG/2ML IJ SOLN
4.0000 mg | Freq: Once | INTRAMUSCULAR | Status: AC
  Administered 2011-09-22: 4 mg via INTRAVENOUS
  Filled 2011-09-22: qty 2

## 2011-09-22 MED ORDER — IOHEXOL 300 MG/ML  SOLN
100.0000 mL | Freq: Once | INTRAMUSCULAR | Status: AC | PRN
  Administered 2011-09-22: 100 mL via INTRAVENOUS

## 2011-09-22 MED ORDER — IOHEXOL 300 MG/ML  SOLN: 20.0000 mL | INTRAMUSCULAR | Status: DC

## 2011-09-22 NOTE — ED Provider Notes (Signed)
History     CSN: [id number]  Arrival date & time 09/22/11  2203   None     Chief Complaint  Patient presents with  . Abdominal Pain  . Nausea   HPI The patient is a 41 year old lady with a past medical history of migraine headache, who presents with nausea and abdominal pain.  Patient symptoms started 2 days ago. Her abdominal pain is located at the epigastric area, 9/10 in severity, sharp and burning-like pain, radiating to the back. It is aggravated by standing up and alleviated by bending the knees to her stomach. At the beginning the abdominal pain is intermittent and then it is progressively become constant. Currently it is 9/10 in severity. It is associated with nausea but not with vomiting. There is no diarrhea.  Patient denies fever, chills, cough, chest pain, hematuria, and hematochezia. She did not have acid reflex problem before. Patient tried over-the-counter "Pepto bismol" at home without help.  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 MRI was recommended, but the patient didn't have it until now. Patient was also found to have free pelvic fluid surrounding the right ovary and findings consistent  with a right ovarian cyst which has ruptured. This might represent a corpus luteum.  Past Medical History  Diagnosis Date  . Migraine     Past Surgical History  Procedure Date  . Abdominal hysterectomy   . Tonsillectomy     Family History  Problem Relation Age of Onset  . Diabetes Mother   . Diabetes Sister   . Hypertension Brother   . Diabetes Maternal Uncle   . Hypertension Maternal Uncle   . Sudden death Paternal Aunt   . Diabetes Maternal Grandfather   . Heart attack Paternal Grandfather   . Hyperlipidemia Neg Hx     History  Substance Use Topics  . Smoking status: Never Smoker   . Smokeless tobacco: Not on file  . Alcohol Use: No    OB History    Grav Para Term Preterm  Abortions TAB SAB Ect Mult Living                  Review of Systems  Constitutional: Negative for fever, chills, diaphoresis, activity change, appetite change, fatigue and unexpected weight change.  HENT: Negative for hearing loss, ear pain, congestion, sore throat, facial swelling, rhinorrhea, sneezing, drooling, mouth sores, trouble swallowing, neck pain, neck stiffness, dental problem, voice change, postnasal drip and tinnitus.   Eyes: Negative for photophobia, pain, discharge, redness and visual disturbance.  Respiratory: Negative for apnea, cough, choking, chest tightness, shortness of breath, wheezing and stridor.   Cardiovascular: Negative for chest pain, palpitations and leg swelling.  Gastrointestinal: Positive for nausea and abdominal pain. Negative for vomiting, diarrhea, constipation, blood in stool, abdominal distention, anal bleeding and rectal pain.  Genitourinary: Negative for dysuria, urgency, frequency, hematuria, flank pain, enuresis, difficulty urinating, genital sores, pelvic pain and dyspareunia.  Musculoskeletal: Negative for back pain, joint swelling, arthralgias and gait problem.  Skin: Negative for color change, pallor, rash and wound.  Neurological: Negative for dizziness, tremors, seizures, syncope, facial asymmetry, weakness, numbness and headaches.  Hematological: Negative for adenopathy. Does not bruise/bleed easily.  Psychiatric/Behavioral: Negative for hallucinations, confusion and agitation.    Allergies  Review of patient's allergies indicates no known allergies.  Home Medications   Current Outpatient Rx  Name Route Sig Dispense Refill  . NITROGLYCERIN 0.2 MG/HR TD PT24  1/4  patch over affected area - change every day 30 patch 1    BP 157/94  Pulse 86  Temp(Src) 98.2 F (36.8 C) (Oral)  Resp 16  Ht 5' (1.524 m)  Wt 160 lb (72.576 kg)  BMI 31.25 kg/m2  SpO2 100%  Physical Exam  General: resting in bed, not in acute distress HEENT: PERRL,  EOMI, no scleral icterus Cardiac: S1/S2, RRR, No murmurs, gallops or rubs Pulm: Good air movement bilaterally, Clear to auscultation bilaterally, No rales, wheezing, rhonchi or rubs. Abd: Soft,  nondistended, tender over epigastric area and RUQ. no rebound pain, no organomegaly, BS present. There is no CVA tenderness. Murphy's sign is questionable.  Ext: No rashes or edema, 2+DP/PT pulse bilaterally Skin: no rashes. No skin bruise. Neuro: alert and oriented X3, cranial nerves II-XII grossly intact, muscle strength 5/5 in all extremeties,  sensation to light touch intact.  Psych.: patient is not psychotic, no suicidal or hemocidal ideation.   ED Course  Procedures (including critical care time)  Patient's vital signs are stable in ED. she was treated with Zofran for nausea and fentanyl for abdominal pain.   CBC shows no leukocytosis. UA is negative for Hgb and infection. CXR is normal. BMP shows hypokalemia with K of 3.3. AST/ALT and ALP are normal. Total bilirubin is 0.2.  Lipase normal.   Hypokalemia was repleted with 40 mEq of KCl orally.  CT abdomen and pelvis with contrast shows liver lesions which has no significant change compared to previous CT results.  Labs Reviewed  URINALYSIS, ROUTINE W REFLEX MICROSCOPIC   No results found.   No diagnosis found.    MDM  Etiology for patient's abdominal pain and the nausea is not clear. It may be related to her liver lesions given that her previous CT scan showed possible hemangioma.  Differential diagnosis includes gastritis, peptic ulcers disease, cholelithiasis, ovarian cysts. It is less likely to be cholecystitis or appendicitis given lack of fever and no leukocytosis. It is also less likely due to diverticulitis given lack of fever and diarrhea, and no leukocytosis. Kidney stone is less likely given no negative Hgb on UA and lack of CVA tenderness on physical examination. Pancreatitis is ruled out by normal lipase. CT abdomen and  pelvis with contrast shows liver lesions which has no significant change compared to previous CT results.  Patient most likely has gastritis. She will be discharged home on PPI. She is instructed to follow up with her PCP for further evaluation of her liver lesions.   Lorretta Harp, MD 09/23/11 [phone]

## 2011-09-22 NOTE — ED Notes (Signed)
Pt c/o upper abd pain and lower back pain w/ nausea only x 4 days

## 2011-09-23 MED ORDER — PANTOPRAZOLE SODIUM 20 MG PO TBEC: 40.0000 mg | DELAYED_RELEASE_TABLET | Freq: Two times a day (BID) | ORAL | Status: DC

## 2011-09-23 NOTE — Discharge Instructions (Signed)
1. Your abdominal pain is most likely due to gastritis. Please take Protonix as prescribed.  2. If you have worsening of your symptoms or new symptoms arise, please call the clinic [phone]), or go to the ER immediately if symptoms are severe. 3. Your previous CT-abdomen showed liver lesions which has not changed significantly on today's CT scan. Please follow up with your PCP for further evaluation of your liver lesions.

## 2011-09-25 NOTE — ED Provider Notes (Signed)
I saw and evaluated the patient, reviewed the resident's note and I agree with the findings and plan.   .Face to face Exam:  General:  Awake HEENT:  Atraumatic Resp:  Normal effort Abd:  Nondistended Neuro:No focal weakness Lymph: No adenopathy   Nelia Shi, MD 09/25/11 [phone]

## 2011-11-08 ENCOUNTER — Other Ambulatory Visit: Payer: Self-pay | Admitting: Internal Medicine

## 2012-03-25 ENCOUNTER — Emergency Department (HOSPITAL_BASED_OUTPATIENT_CLINIC_OR_DEPARTMENT_OTHER)
Admission: EM | Admit: 2012-03-25 | Discharge: 2012-03-25 | Disposition: A | Discharge status: Home / Self Care | Payer: BC Managed Care – PPO | Attending: Emergency Medicine | Admitting: Emergency Medicine

## 2012-03-25 ENCOUNTER — Encounter (HOSPITAL_BASED_OUTPATIENT_CLINIC_OR_DEPARTMENT_OTHER): Payer: Self-pay | Admitting: Emergency Medicine

## 2012-03-25 ENCOUNTER — Emergency Department (HOSPITAL_BASED_OUTPATIENT_CLINIC_OR_DEPARTMENT_OTHER): Payer: BC Managed Care – PPO

## 2012-03-25 DIAGNOSIS — R0789 Other chest pain: Secondary | ICD-10-CM | POA: Insufficient documentation

## 2012-03-25 DIAGNOSIS — R42 Dizziness and giddiness: Secondary | ICD-10-CM | POA: Insufficient documentation

## 2012-03-25 DIAGNOSIS — Z8669 Personal history of other diseases of the nervous system and sense organs: Secondary | ICD-10-CM | POA: Insufficient documentation

## 2012-03-25 LAB — COMPREHENSIVE METABOLIC PANEL
ALT: 16 U/L (ref 0–35)
Alkaline Phosphatase: 100 U/L (ref 39–117)
CO2: 26 mEq/L (ref 19–32)
Chloride: 105 mEq/L (ref 96–112)
GFR calc Af Amer: >90 mL/min (ref >90)
GFR calc non Af Amer: >90 mL/min (ref >90)
Glucose, Bld: 88 mg/dL (ref 70–99)
Potassium: 3.3 mEq/L — ABNORMAL LOW (ref 3.5–5.1)
Sodium: 141 mEq/L (ref 135–145)
Total Bilirubin: 0.1 mg/dL — ABNORMAL LOW (ref 0.3–1.2)

## 2012-03-25 LAB — CBC WITH DIFFERENTIAL/PLATELET
Hemoglobin: 11.8 g/dL — ABNORMAL LOW (ref 12.0–15.0)
Lymphocytes Relative: 43 % (ref 12–46)
Lymphs Abs: 2.1 10*3/uL (ref 0.7–4.0)
Neutrophils Relative %: 50 % (ref 43–77)
Platelets: 255 10*3/uL (ref 150–400)
RBC: 4.08 MIL/uL (ref 3.87–5.11)
WBC: 5 10*3/uL (ref 4.0–10.5)

## 2012-03-25 MED ORDER — ASPIRIN 81 MG PO CHEW
324.0000 mg | CHEWABLE_TABLET | Freq: Once | ORAL | Status: AC
  Administered 2012-03-25: 324 mg via ORAL

## 2012-03-25 MED ORDER — ASPIRIN 81 MG PO CHEW
CHEWABLE_TABLET | ORAL | Status: AC
  Administered 2012-03-25: 324 mg via ORAL
  Filled 2012-03-25: qty 4

## 2012-03-25 NOTE — ED Provider Notes (Signed)
History     CSN: [id number]  Arrival date & time 03/25/12  0847   First MD Initiated Contact with Patient 03/25/12 [phone]      Chief Complaint  Patient presents with  . Chest Pain  . Dizziness    (Consider location/radiation/quality/duration/timing/severity/associated sxs/prior treatment) HPI Comments: Patient presents with episode of chest pain onset while she was at work. It was sharp stabbing in the center chest that lasted for about 1 minute. It has since resolved into a dull ache that has been ongoing for the past hour. It radiates to her mid back. She's not had pain like this in the past. She denies any shortness of breath, nausea, vomiting or sweating. She's been having intermittent lightheadedness and dizziness over the past 3 days that is worse with position changes. She thought this was due to her migraines. She denies having a headache at this time. She denies any vertigo. Is no cardiac history he has never had a stress test. She does not smoke.  The history is provided by the patient.    Past Medical History  Diagnosis Date  . Migraine     Past Surgical History  Procedure Date  . Abdominal hysterectomy   . Tonsillectomy     Family History  Problem Relation Age of Onset  . Diabetes Mother   . Diabetes Sister   . Hypertension Brother   . Diabetes Maternal Uncle   . Hypertension Maternal Uncle   . Sudden death Paternal Aunt   . Diabetes Maternal Grandfather   . Heart attack Paternal Grandfather   . Hyperlipidemia Neg Hx     History  Substance Use Topics  . Smoking status: Never Smoker   . Smokeless tobacco: Not on file  . Alcohol Use: No    OB History    Grav Para Term Preterm Abortions TAB SAB Ect Mult Living                  Review of Systems  Constitutional: Negative for fever, activity change and appetite change.  HENT: Negative for congestion and rhinorrhea.   Respiratory: Positive for chest tightness. Negative for cough and shortness of  breath.   Cardiovascular: Positive for chest pain.  Gastrointestinal: Negative for nausea and abdominal pain.  Genitourinary: Negative for dysuria.  Musculoskeletal: Negative for arthralgias.  Skin: Negative for rash.  Neurological: Positive for dizziness and light-headedness. Negative for weakness and headaches.    Allergies  Review of patient's allergies indicates no known allergies.  Home Medications   Current Outpatient Rx  Name  Route  Sig  Dispense  Refill  . BISMUTH SUBSALICYLATE 262 MG/15ML PO SUSP   Oral   Take 30 mLs by mouth every 6 (six) hours as needed. Patient used this medication for an upset stomach.         . IBUPROFEN 200 MG PO TABS   Oral   Take 400 mg by mouth every 6 (six) hours as needed. Patient used this medication for her headache.           BP 120/87  Pulse 63  Temp 98.4 F (36.9 C) (Oral)  Resp 16  Ht 5\' 1"  (1.549 m)  Wt 168 lb (76.204 kg)  BMI 31.74 kg/m2  SpO2 100%  Physical Exam  Constitutional: She is oriented to person, place, and time. She appears well-developed and well-nourished. No distress.  HENT:  Head: Normocephalic and atraumatic.  Mouth/Throat: Oropharynx is clear and moist. No oropharyngeal exudate.  Eyes: Conjunctivae normal  and EOM are normal. Pupils are equal, round, and reactive to light.  Neck: Normal range of motion. Neck supple.  Cardiovascular: Normal rate, regular rhythm and normal heart sounds.   No murmur heard. Pulmonary/Chest: Breath sounds normal. No respiratory distress. She exhibits tenderness.       TTP along sternum  Abdominal: Soft. There is no tenderness. There is no rebound and no guarding.  Musculoskeletal: Normal range of motion. She exhibits no edema and no tenderness.  Neurological: She is alert and oriented to person, place, and time. No cranial nerve deficit. She exhibits normal muscle tone. Coordination normal.       5/5 strength throughout, no ataxia on finger to nose, CN 2-12 intact. No  nystagmus. Test of skew negative, head impulse testing negative. No pronator drift.  Skin: Skin is warm.    ED Course  Procedures (including critical care time)  Labs Reviewed  CBC WITH DIFFERENTIAL - Abnormal; Notable for the following:    Hemoglobin 11.8 (*)     HCT 35.2 (*)     All other components within normal limits  COMPREHENSIVE METABOLIC PANEL - Abnormal; Notable for the following:    Potassium 3.3 (*)     Total Bilirubin 0.1 (*)     All other components within normal limits  TROPONIN I  D-DIMER, QUANTITATIVE  TROPONIN I   Dg Chest 2 View  03/25/2012  *RADIOLOGY REPORT*  Clinical Data: Chest pain, dizziness  CHEST - 2 VIEW  Comparison: Chest x-ray of 09/22/2011  Findings: The lungs are clear.  Mediastinal contours are stable. Borderline cardiomegaly is stable.  No bony abnormality is seen.  IMPRESSION: No active lung disease.  Stable borderline cardiomegaly.   Original Report Authenticated By: Dwyane Dee, M.D.      1. Atypical chest pain       MDM  Episode of sharp stabbing chest pain lasted for about 1 minute radiating to the back. Now dull ache. EKG nonischemic, neuro exam unremarkable  CXR negative.  D-dimer negative.  Delta troponin negative. Pain atypical for ACS.   With negative EKG and delta troponin, feel patient safe for discharge. Offered CDU stress test which patient declined.  She will follow up with her doctor.   Date: 03/25/2012  Rate: 67  Rhythm: normal sinus rhythm  QRS Axis: normal  Intervals: normal  ST/T Wave abnormalities: normal  Conduction Disutrbances:none   Narrative Interpretation:   Old EKG Reviewed: none available       Glynn Octave, MD 03/25/12 1537

## 2012-03-25 NOTE — ED Notes (Addendum)
Pt verbalizes understanding of f/u care; family present at bedside- worknote for 2 days given per EDP Rancour

## 2012-03-25 NOTE — ED Notes (Signed)
Pt c/o dizziness for three days.  This am at 0800, pt started with central chest pain radiating to neck and back.  Some SOB.  No diaphoresis,  No N/V.  Pt was folding socks when pain occurred.

## 2012-03-25 NOTE — ED Notes (Signed)
MD at bedside. 

## 2012-03-25 NOTE — ED Notes (Signed)
Patient transported to X-ray 

## 2012-08-02 ENCOUNTER — Emergency Department (HOSPITAL_BASED_OUTPATIENT_CLINIC_OR_DEPARTMENT_OTHER)
Admission: EM | Admit: 2012-08-02 | Discharge: 2012-08-02 | Disposition: A | Discharge status: Home / Self Care | Payer: BC Managed Care – PPO | Attending: Emergency Medicine | Admitting: Emergency Medicine

## 2012-08-02 DIAGNOSIS — S46909A Unspecified injury of unspecified muscle, fascia and tendon at shoulder and upper arm level, unspecified arm, initial encounter: Secondary | ICD-10-CM | POA: Insufficient documentation

## 2012-08-02 DIAGNOSIS — Y9289 Other specified places as the place of occurrence of the external cause: Secondary | ICD-10-CM | POA: Insufficient documentation

## 2012-08-02 DIAGNOSIS — S4980XA Other specified injuries of shoulder and upper arm, unspecified arm, initial encounter: Secondary | ICD-10-CM | POA: Insufficient documentation

## 2012-08-02 DIAGNOSIS — Z79899 Other long term (current) drug therapy: Secondary | ICD-10-CM | POA: Insufficient documentation

## 2012-08-02 DIAGNOSIS — W1809XA Striking against other object with subsequent fall, initial encounter: Secondary | ICD-10-CM | POA: Insufficient documentation

## 2012-08-02 DIAGNOSIS — S0993XA Unspecified injury of face, initial encounter: Secondary | ICD-10-CM | POA: Insufficient documentation

## 2012-08-02 DIAGNOSIS — Y9389 Activity, other specified: Secondary | ICD-10-CM | POA: Insufficient documentation

## 2012-08-02 DIAGNOSIS — W010XXA Fall on same level from slipping, tripping and stumbling without subsequent striking against object, initial encounter: Secondary | ICD-10-CM | POA: Insufficient documentation

## 2012-08-02 DIAGNOSIS — S46911A Strain of unspecified muscle, fascia and tendon at shoulder and upper arm level, right arm, initial encounter: Secondary | ICD-10-CM

## 2012-08-02 DIAGNOSIS — G43909 Migraine, unspecified, not intractable, without status migrainosus: Secondary | ICD-10-CM | POA: Insufficient documentation

## 2012-08-02 DIAGNOSIS — R209 Unspecified disturbances of skin sensation: Secondary | ICD-10-CM | POA: Insufficient documentation

## 2012-08-02 MED ORDER — CYCLOBENZAPRINE HCL 10 MG PO TABS: 10.0000 mg | ORAL_TABLET | Freq: Two times a day (BID) | ORAL | Status: DC | PRN

## 2012-08-02 NOTE — ED Provider Notes (Signed)
History     CSN: [id number]  Arrival date & time 08/02/12  1126   First MD Initiated Contact with Patient 08/02/12 1207      Chief Complaint  Patient presents with  . Shoulder Pain  . Neck Pain    (Consider location/radiation/quality/duration/timing/severity/associated sxs/prior treatment) HPI Comments: 42 y/o female presents to the ED complaining of right shoulder and neck pain x 3days. Patient states 3 days ago she was helping with her brother's wedding when she felt a "pull" in her shoulder. Pain was intermittent until it worsened 1 day ago and became constant. Describes the pain as both sharp and throbbing rated 10/10, worse with moving her shoulder and looking laterally to the right. Tried taking tramadol and applying heat without relief. Admits to associated tingling down right arm.   Patient is a 42 y.o. female presenting with shoulder pain and neck pain. The history is provided by the patient.  Shoulder Pain Associated symptoms include neck pain. Pertinent negatives include no chest pain.  Neck Pain Associated symptoms: no chest pain     Past Medical History  Diagnosis Date  . Migraine     Past Surgical History  Procedure Laterality Date  . Abdominal hysterectomy    . Tonsillectomy      Family History  Problem Relation Age of Onset  . Diabetes Mother   . Diabetes Sister   . Hypertension Brother   . Diabetes Maternal Uncle   . Hypertension Maternal Uncle   . Sudden death Paternal Aunt   . Diabetes Maternal Grandfather   . Heart attack Paternal Grandfather   . Hyperlipidemia Neg Hx     History  Substance Use Topics  . Smoking status: Never Smoker   . Smokeless tobacco: Not on file  . Alcohol Use: No    OB History   Grav Para Term Preterm Abortions TAB SAB Ect Mult Living                  Review of Systems  HENT: Positive for neck pain.   Respiratory: Negative for shortness of breath.   Cardiovascular: Negative for chest pain.  Musculoskeletal:        Positive for right shoulder pain.  Skin: Negative for color change.  Neurological:       Positive for tingling.  All other systems reviewed and are negative.    Allergies  Review of patient's allergies indicates no known allergies.  Home Medications   Current Outpatient Rx  Name  Route  Sig  Dispense  Refill  . TRAMADOL HCL PO   Oral   Take by mouth.         . bismuth subsalicylate (PEPTO BISMOL) 262 MG/15ML suspension   Oral   Take 30 mLs by mouth every 6 (six) hours as needed. Patient used this medication for an upset stomach.         . cyclobenzaprine (FLEXERIL) 10 MG tablet   Oral   Take 1 tablet (10 mg total) by mouth 2 (two) times daily as needed for muscle spasms.   20 tablet   0   . ibuprofen (ADVIL,MOTRIN) 200 MG tablet   Oral   Take 400 mg by mouth every 6 (six) hours as needed. Patient used this medication for her headache.           BP 136/90  Pulse 83  Temp(Src) 98.2 F (36.8 C) (Oral)  Resp 20  Ht 5\' 1"  (1.549 m)  Wt 168 lb (76.204 kg)  BMI 31.76 kg/m2  SpO2 98%  Physical Exam  Nursing note and vitals reviewed. Constitutional: She is oriented to person, place, and time. She appears well-developed and well-nourished. No distress.  HENT:  Head: Normocephalic and atraumatic.  Eyes: Conjunctivae are normal.  Neck: Neck supple. Muscular tenderness (right trapezius) present. No spinous process tenderness present. Normal range of motion (pain with lateral rotation to right) present.  Cardiovascular: Normal rate, regular rhythm, normal heart sounds and intact distal pulses.   Pulmonary/Chest: Effort normal and breath sounds normal.  Musculoskeletal: She exhibits no edema.       Right shoulder: She exhibits tenderness (anterior aspect of shoulder). She exhibits normal range of motion, no bony tenderness, no swelling, no crepitus, no deformity, no spasm, normal pulse and normal strength.  Neurological: She is alert and oriented to person,  place, and time. She has normal strength. No sensory deficit.  Skin: Skin is warm, dry and intact. No bruising and no ecchymosis noted. She is not diaphoretic.  Psychiatric: She has a normal mood and affect. Her behavior is normal.    ED Course  Procedures (including critical care time)  Labs Reviewed - No data to display No results found.   1. Shoulder strain, right, initial encounter       MDM  Shoulder sprain/strain- advised rest, ice/heat, ibuprofen. Rx for flexeril given. No red flags concerning neck pain. No spinous process tenderness. No focal neuro deficits. Neurovascularly intact with normal strength. Return precautions discussed. She will f/u with her pcp. Patient states understanding of plan and is agreeable.         Trevor Mace, PA-C 08/02/12 1237

## 2012-08-02 NOTE — ED Provider Notes (Signed)
Medical screening examination/treatment/procedure(s) were performed by non-physician practitioner and as supervising physician I was immediately available for consultation/collaboration.   Charles B. Sheldon, MD 08/02/12 1644 

## 2012-08-02 NOTE — ED Notes (Signed)
Patient reports pain in right lower neck and shoulder with some numbness moving down arm.

## 2012-08-02 NOTE — ED Notes (Addendum)
Patient states that she slipped on ice and fell last week going down stairs and hit her right elbow on wooden stairs.

## 2012-12-09 ENCOUNTER — Encounter (HOSPITAL_BASED_OUTPATIENT_CLINIC_OR_DEPARTMENT_OTHER): Payer: Self-pay

## 2012-12-09 ENCOUNTER — Emergency Department (HOSPITAL_BASED_OUTPATIENT_CLINIC_OR_DEPARTMENT_OTHER)
Admission: EM | Admit: 2012-12-09 | Discharge: 2012-12-09 | Disposition: A | Discharge status: Home / Self Care | Payer: BC Managed Care – PPO | Attending: Emergency Medicine | Admitting: Emergency Medicine

## 2012-12-09 DIAGNOSIS — L0291 Cutaneous abscess, unspecified: Secondary | ICD-10-CM

## 2012-12-09 DIAGNOSIS — IMO0002 Reserved for concepts with insufficient information to code with codable children: Secondary | ICD-10-CM | POA: Insufficient documentation

## 2012-12-09 DIAGNOSIS — G43909 Migraine, unspecified, not intractable, without status migrainosus: Secondary | ICD-10-CM | POA: Insufficient documentation

## 2012-12-09 MED ORDER — DOXYCYCLINE HYCLATE 100 MG PO TABS
ORAL_TABLET | ORAL | Status: AC
  Filled 2012-12-09: qty 1

## 2012-12-09 MED ORDER — TRAMADOL HCL 50 MG PO TABS: 50.0000 mg | ORAL_TABLET | Freq: Four times a day (QID) | ORAL | Status: DC | PRN

## 2012-12-09 MED ORDER — DOXYCYCLINE HYCLATE 100 MG PO TABS
100.0000 mg | ORAL_TABLET | Freq: Once | ORAL | Status: AC
  Administered 2012-12-09: 100 mg via ORAL

## 2012-12-09 MED ORDER — DOXYCYCLINE HYCLATE 100 MG PO CAPS: 100.0000 mg | ORAL_CAPSULE | Freq: Two times a day (BID) | ORAL | Status: DC

## 2012-12-09 NOTE — ED Notes (Signed)
Left axilla abscess x 3 weeks-reports is draining

## 2012-12-09 NOTE — ED Provider Notes (Signed)
CSN: [id number]     Arrival date & time 12/09/12  2027 History     First MD Initiated Contact with Patient 12/09/12 2301     Chief Complaint  Patient presents with  . Abscess   (Consider location/radiation/quality/duration/timing/severity/associated sxs/prior Treatment) Patient is a 42 y.o. female presenting with abscess. The history is provided by the patient.  Abscess Location:  Shoulder/arm Shoulder/arm abscess location:  L axilla Abscess quality: draining, fluctuance, induration, painful and redness   Red streaking: no   Progression:  Worsening Pain details:    Quality:  Aching   Timing:  Constant   Progression:  Worsening Context: not diabetes   Relieved by:  Nothing Worsened by:  Nothing tried Ineffective treatments:  None tried Associated symptoms: no anorexia and no fever   Risk factors: no family hx of MRSA     Past Medical History  Diagnosis Date  . Migraine    Past Surgical History  Procedure Laterality Date  . Abdominal hysterectomy    . Tonsillectomy     Family History  Problem Relation Age of Onset  . Diabetes Mother   . Diabetes Sister   . Hypertension Brother   . Diabetes Maternal Uncle   . Hypertension Maternal Uncle   . Sudden death Paternal Aunt   . Diabetes Maternal Grandfather   . Heart attack Paternal Grandfather   . Hyperlipidemia Neg Hx    History  Substance Use Topics  . Smoking status: Never Smoker   . Smokeless tobacco: Not on file  . Alcohol Use: No   OB History   Grav Para Term Preterm Abortions TAB SAB Ect Mult Living                 Review of Systems  Constitutional: Negative for fever.  Gastrointestinal: Negative for anorexia.  All other systems reviewed and are negative.    Allergies  Review of patient's allergies indicates no known allergies.  Home Medications   Current Outpatient Rx  Name  Route  Sig  Dispense  Refill  . Acetaminophen (TYLENOL PO)   Oral   Take by mouth.         . bismuth subsalicylate  (PEPTO BISMOL) 262 MG/15ML suspension   Oral   Take 30 mLs by mouth every 6 (six) hours as needed. Patient used this medication for an upset stomach.         . cyclobenzaprine (FLEXERIL) 10 MG tablet   Oral   Take 1 tablet (10 mg total) by mouth 2 (two) times daily as needed for muscle spasms.   20 tablet   0   . ibuprofen (ADVIL,MOTRIN) 200 MG tablet   Oral   Take 400 mg by mouth every 6 (six) hours as needed. Patient used this medication for her headache.         . TRAMADOL HCL PO   Oral   Take by mouth.          BP 149/84  Pulse 71  Temp(Src) 98.8 F (37.1 C) (Oral)  Resp 20  Ht 5' (1.524 m)  Wt 168 lb (76.204 kg)  BMI 32.81 kg/m2  SpO2 100% Physical Exam  Constitutional: She is oriented to person, place, and time. She appears well-developed and well-nourished. No distress.  HENT:  Head: Normocephalic and atraumatic.  Mouth/Throat: Oropharynx is clear and moist.  Eyes: Conjunctivae are normal. Pupils are equal, round, and reactive to light.  Neck: Normal range of motion. Neck supple.  Cardiovascular: Normal rate, regular rhythm  and intact distal pulses.   Pulmonary/Chest: Effort normal and breath sounds normal. She has no wheezes. She has no rales.  Abdominal: Soft. Bowel sounds are normal. There is no tenderness. There is no rebound and no guarding.  Musculoskeletal: Normal range of motion.  Lymphadenopathy:    She has no cervical adenopathy.  Neurological: She is alert and oriented to person, place, and time.  Skin: Skin is warm and dry.     Psychiatric: She has a normal mood and affect.    ED Course   Procedures (including critical care time)  Labs Reviewed - No data to display No results found. No diagnosis found.  MDM  Abscess had a 5 mm ovoid opening actively draining purulent material.  With palpation of area a large amount of purulent material was expressed and sebaceous cyst cavity popped out.  Area was deflated.  No indication for incision.     Jasmine Awe, MD 12/10/12 [phone]

## 2014-01-09 ENCOUNTER — Encounter (HOSPITAL_BASED_OUTPATIENT_CLINIC_OR_DEPARTMENT_OTHER): Payer: Self-pay | Admitting: Emergency Medicine

## 2014-01-09 ENCOUNTER — Emergency Department (HOSPITAL_BASED_OUTPATIENT_CLINIC_OR_DEPARTMENT_OTHER)
Admission: EM | Admit: 2014-01-09 | Discharge: 2014-01-09 | Disposition: A | Discharge status: Home / Self Care | Payer: BC Managed Care – PPO | Attending: Emergency Medicine | Admitting: Emergency Medicine

## 2014-01-09 DIAGNOSIS — R519 Headache, unspecified: Secondary | ICD-10-CM

## 2014-01-09 DIAGNOSIS — R51 Headache: Secondary | ICD-10-CM | POA: Insufficient documentation

## 2014-01-09 DIAGNOSIS — G43909 Migraine, unspecified, not intractable, without status migrainosus: Secondary | ICD-10-CM | POA: Insufficient documentation

## 2014-01-09 DIAGNOSIS — Z79899 Other long term (current) drug therapy: Secondary | ICD-10-CM | POA: Insufficient documentation

## 2014-01-09 MED ORDER — KETOROLAC TROMETHAMINE 30 MG/ML IJ SOLN
30.0000 mg | Freq: Once | INTRAMUSCULAR | Status: AC
  Administered 2014-01-09: 30 mg via INTRAVENOUS
  Filled 2014-01-09: qty 1

## 2014-01-09 MED ORDER — DEXAMETHASONE SODIUM PHOSPHATE 10 MG/ML IJ SOLN: 10.0000 mg | Freq: Once | INTRAMUSCULAR | Status: DC

## 2014-01-09 MED ORDER — SODIUM CHLORIDE 0.9 % IV BOLUS (SEPSIS)
1000.0000 mL | Freq: Once | INTRAVENOUS | Status: AC
  Administered 2014-01-09: 1000 mL via INTRAVENOUS

## 2014-01-09 MED ORDER — DIPHENHYDRAMINE HCL 50 MG/ML IJ SOLN
25.0000 mg | Freq: Once | INTRAMUSCULAR | Status: AC
  Administered 2014-01-09: 25 mg via INTRAVENOUS
  Filled 2014-01-09: qty 1

## 2014-01-09 MED ORDER — METOCLOPRAMIDE HCL 5 MG/ML IJ SOLN
10.0000 mg | Freq: Once | INTRAMUSCULAR | Status: AC
  Administered 2014-01-09: 10 mg via INTRAVENOUS
  Filled 2014-01-09: qty 2

## 2014-01-09 MED ORDER — METHYLPREDNISOLONE SODIUM SUCC 125 MG IJ SOLR
125.0000 mg | Freq: Once | INTRAMUSCULAR | Status: AC
  Administered 2014-01-09: 125 mg via INTRAVENOUS
  Filled 2014-01-09: qty 2

## 2014-01-09 NOTE — ED Provider Notes (Signed)
CSN: [id number]     Arrival date & time 01/09/14  4098 History   First MD Initiated Contact with Patient 01/09/14 [phone]     Chief Complaint  Patient presents with  . Headache     (Consider location/radiation/quality/duration/timing/severity/associated sxs/prior Treatment) HPI Comments: Patient is a 43 year old female with history of migraine headaches. She presents with a three-day history of pain in the front of her head radiating down the left side of her face. She denies any visual disturbances. She denies any nausea or vomiting. She's had no relief with medications that she uses at home. This feels like her prior migraines however she's never had a left facial radiation before.  Patient is a 43 y.o. female presenting with headaches. The history is provided by the patient.  Headache Pain location:  Frontal Quality:  Dull Radiates to: Left side of face. Onset quality:  Sudden Duration:  3 days Timing:  Constant Progression:  Unchanged Chronicity:  New Similar to prior headaches: no   Context: activity and bright light   Relieved by:  Nothing Worsened by:  Nothing tried Ineffective treatments: Excedrin.   Past Medical History  Diagnosis Date  . Migraine    Past Surgical History  Procedure Laterality Date  . Abdominal hysterectomy    . Tonsillectomy     Family History  Problem Relation Age of Onset  . Diabetes Mother   . Diabetes Sister   . Hypertension Brother   . Diabetes Maternal Uncle   . Hypertension Maternal Uncle   . Sudden death Paternal Aunt   . Diabetes Maternal Grandfather   . Heart attack Paternal Grandfather   . Hyperlipidemia Neg Hx    History  Substance Use Topics  . Smoking status: Never Smoker   . Smokeless tobacco: Not on file  . Alcohol Use: No   OB History   Grav Para Term Preterm Abortions TAB SAB Ect Mult Living                 Review of Systems  Neurological: Positive for headaches.  All other systems reviewed and are  negative.     Allergies  Review of patient's allergies indicates no known allergies.  Home Medications   Prior to Admission medications   Medication Sig Start Date End Date Taking? Authorizing Provider  Acetaminophen (TYLENOL PO) Take by mouth.    Historical Provider, MD  bismuth subsalicylate (PEPTO BISMOL) 262 MG/15ML suspension Take 30 mLs by mouth every 6 (six) hours as needed. Patient used this medication for an upset stomach.    Historical Provider, MD  cyclobenzaprine (FLEXERIL) 10 MG tablet Take 1 tablet (10 mg total) by mouth 2 (two) times daily as needed for muscle spasms. 08/02/12   Trevor Mace, PA-C  doxycycline (VIBRAMYCIN) 100 MG capsule Take 1 capsule (100 mg total) by mouth 2 (two) times daily. One po bid x 7 days 12/09/12   April K Palumbo-Rasch, MD  ibuprofen (ADVIL,MOTRIN) 200 MG tablet Take 400 mg by mouth every 6 (six) hours as needed. Patient used this medication for her headache.    Historical Provider, MD  traMADol (ULTRAM) 50 MG tablet Take 1 tablet (50 mg total) by mouth every 6 (six) hours as needed for pain. 12/09/12   April K Palumbo-Rasch, MD  TRAMADOL HCL PO Take by mouth.    Historical Provider, MD   BP 131/80  Pulse 68  Temp(Src) 98.4 F (36.9 C) (Oral)  Resp 16  Ht 5' (1.524 m)  Wt 160 lb (  72.576 kg)  BMI 31.25 kg/m2  SpO2 100% Physical Exam  Nursing note and vitals reviewed. Constitutional: She is oriented to person, place, and time. She appears well-developed and well-nourished. No distress.  HENT:  Head: Normocephalic and atraumatic.  Mouth/Throat: Oropharynx is clear and moist.  Eyes: EOM are normal. Pupils are equal, round, and reactive to light.  There is no papilledema on funduscopic exam  Neck: Normal range of motion. Neck supple.  Lymphadenopathy:    She has no cervical adenopathy.  Neurological: She is alert and oriented to person, place, and time. No cranial nerve deficit. She exhibits normal muscle tone. Coordination normal.  Skin:  Skin is warm and dry. She is not diaphoretic.    ED Course  Procedures (including critical care time) Labs Review Labs Reviewed - No data to display  Imaging Review No results found.   EKG Interpretation None      MDM   Final diagnoses:  None    Patient feeling better with fluids and medications given in the ER. She has no red flags that would suggest an emergent cause. I do not feel as though CT/LP is indicated. She understands to return if her symptoms worsen or change.    Geoffery Lyons, MD 01/09/14 [phone]

## 2014-01-09 NOTE — ED Notes (Signed)
Pt c/o of frontal upper Headache that began 3 days ago. Pain is now radiating down the left side of her face. Pt has migraines but sts this one feels a little worse than usual and they usually don't radiate down her face.

## 2014-01-09 NOTE — Discharge Instructions (Signed)

## 2014-10-12 ENCOUNTER — Emergency Department (HOSPITAL_BASED_OUTPATIENT_CLINIC_OR_DEPARTMENT_OTHER)
Admission: EM | Admit: 2014-10-12 | Discharge: 2014-10-12 | Disposition: A | Discharge status: Home / Self Care | Payer: BLUE CROSS/BLUE SHIELD | Attending: Emergency Medicine | Admitting: Emergency Medicine

## 2014-10-12 ENCOUNTER — Encounter (HOSPITAL_BASED_OUTPATIENT_CLINIC_OR_DEPARTMENT_OTHER): Payer: Self-pay | Admitting: *Deleted

## 2014-10-12 ENCOUNTER — Emergency Department (HOSPITAL_BASED_OUTPATIENT_CLINIC_OR_DEPARTMENT_OTHER): Payer: BLUE CROSS/BLUE SHIELD

## 2014-10-12 DIAGNOSIS — R202 Paresthesia of skin: Secondary | ICD-10-CM | POA: Diagnosis not present

## 2014-10-12 DIAGNOSIS — Z8679 Personal history of other diseases of the circulatory system: Secondary | ICD-10-CM | POA: Diagnosis not present

## 2014-10-12 DIAGNOSIS — R109 Unspecified abdominal pain: Secondary | ICD-10-CM | POA: Insufficient documentation

## 2014-10-12 DIAGNOSIS — M5431 Sciatica, right side: Secondary | ICD-10-CM | POA: Diagnosis not present

## 2014-10-12 DIAGNOSIS — N898 Other specified noninflammatory disorders of vagina: Secondary | ICD-10-CM | POA: Insufficient documentation

## 2014-10-12 DIAGNOSIS — R112 Nausea with vomiting, unspecified: Secondary | ICD-10-CM | POA: Diagnosis not present

## 2014-10-12 LAB — CBC WITH DIFFERENTIAL/PLATELET
BASOS PCT: 0 % (ref 0–1)
Basophils Absolute: 0 10*3/uL (ref 0.0–0.1)
EOS ABS: 0.1 10*3/uL (ref 0.0–0.7)
Eosinophils Relative: 1 % (ref 0–5)
HEMATOCRIT: 36 % (ref 36.0–46.0)
HEMOGLOBIN: 11.6 g/dL — AB (ref 12.0–15.0)
LYMPHS PCT: 47 % — AB (ref 12–46)
Lymphs Abs: 3.1 10*3/uL (ref 0.7–4.0)
MCH: 28.6 pg (ref 26.0–34.0)
MCHC: 32.2 g/dL (ref 30.0–36.0)
MCV: 88.9 fL (ref 78.0–100.0)
MONO ABS: 0.5 10*3/uL (ref 0.1–1.0)
Monocytes Relative: 7 % (ref 3–12)
NEUTROS ABS: 2.9 10*3/uL (ref 1.7–7.7)
Neutrophils Relative %: 45 % (ref 43–77)
PLATELETS: 281 10*3/uL (ref 150–400)
RBC: 4.05 MIL/uL (ref 3.87–5.11)
RDW: 12.6 % (ref 11.5–15.5)
WBC: 6.5 10*3/uL (ref 4.0–10.5)

## 2014-10-12 LAB — COMPREHENSIVE METABOLIC PANEL
ALT: 14 U/L (ref 14–54)
ANION GAP: 8 (ref 5–15)
AST: 16 U/L (ref 15–41)
Albumin: 3.9 g/dL (ref 3.5–5.0)
Alkaline Phosphatase: 96 U/L (ref 38–126)
BUN: 12 mg/dL (ref 6–20)
CALCIUM: 9.2 mg/dL (ref 8.9–10.3)
CO2: 24 mmol/L (ref 22–32)
Chloride: 109 mmol/L (ref 101–111)
Creatinine, Ser: 0.62 mg/dL (ref 0.44–1.00)
GFR calc Af Amer: >60 mL/min (ref >60)
GFR calc non Af Amer: >60 mL/min (ref >60)
Glucose, Bld: 89 mg/dL (ref 65–99)
POTASSIUM: 3.3 mmol/L — AB (ref 3.5–5.1)
Sodium: 141 mmol/L (ref 135–145)
TOTAL PROTEIN: 7.9 g/dL (ref 6.5–8.1)
Total Bilirubin: 0.3 mg/dL (ref 0.3–1.2)

## 2014-10-12 LAB — URINALYSIS, ROUTINE W REFLEX MICROSCOPIC
Bilirubin Urine: NEGATIVE
Glucose, UA: NEGATIVE mg/dL
HGB URINE DIPSTICK: NEGATIVE
Ketones, ur: NEGATIVE mg/dL
LEUKOCYTES UA: NEGATIVE
NITRITE: NEGATIVE
PROTEIN: NEGATIVE mg/dL
SPECIFIC GRAVITY, URINE: 1.04 — AB (ref 1.005–1.030)
UROBILINOGEN UA: 0.2 mg/dL (ref 0.0–1.0)
pH: 5.5 (ref 5.0–8.0)

## 2014-10-12 LAB — LIPASE, BLOOD: LIPASE: 28 U/L (ref 22–51)

## 2014-10-12 LAB — WET PREP, GENITAL
Clue Cells Wet Prep HPF POC: NONE SEEN
TRICH WET PREP: NONE SEEN
YEAST WET PREP: NONE SEEN

## 2014-10-12 MED ORDER — SODIUM CHLORIDE 0.9 % IV SOLN
INTRAVENOUS | Status: DC
  Administered 2014-10-12: 1000 mL via INTRAVENOUS

## 2014-10-12 MED ORDER — MORPHINE SULFATE 4 MG/ML IJ SOLN
4.0000 mg | Freq: Once | INTRAMUSCULAR | Status: AC
  Administered 2014-10-12: 4 mg via INTRAVENOUS
  Filled 2014-10-12: qty 1

## 2014-10-12 MED ORDER — OXYCODONE-ACETAMINOPHEN 5-325 MG PO TABS: 1.0000 | ORAL_TABLET | ORAL | Status: DC | PRN

## 2014-10-12 MED ORDER — IBUPROFEN 800 MG PO TABS: 800.0000 mg | ORAL_TABLET | Freq: Three times a day (TID) | ORAL | Status: DC | PRN

## 2014-10-12 MED ORDER — ONDANSETRON 4 MG PO TBDP: 4.0000 mg | ORAL_TABLET | Freq: Three times a day (TID) | ORAL | Status: DC | PRN

## 2014-10-12 MED ORDER — ONDANSETRON HCL 4 MG/2ML IJ SOLN
4.0000 mg | Freq: Once | INTRAMUSCULAR | Status: AC
  Administered 2014-10-12: 4 mg via INTRAVENOUS
  Filled 2014-10-12: qty 2

## 2014-10-12 NOTE — Discharge Instructions (Signed)
Sciatica °Sciatica is pain, weakness, numbness, or tingling along the path of the sciatic nerve. The nerve starts in the lower back and runs down the back of each leg. The nerve controls the muscles in the lower leg and in the back of the knee, while also providing sensation to the back of the thigh, lower leg, and the sole of your foot. Sciatica is a symptom of another medical condition. For instance, nerve damage or certain conditions, such as a herniated disk or bone spur on the spine, pinch or put pressure on the sciatic nerve. This causes the pain, weakness, or other sensations normally associated with sciatica. Generally, sciatica only affects one side of the body. °CAUSES  °· Herniated or slipped disc. °· Degenerative disk disease. °· A pain disorder involving the narrow muscle in the buttocks (piriformis syndrome). °· Pelvic injury or fracture. °· Pregnancy. °· Tumor (rare). °SYMPTOMS  °Symptoms can vary from mild to very severe. The symptoms usually travel from the low back to the buttocks and down the back of the leg. Symptoms can include: °· Mild tingling or dull aches in the lower back, leg, or hip. °· Numbness in the back of the calf or sole of the foot. °· Burning sensations in the lower back, leg, or hip. °· Sharp pains in the lower back, leg, or hip. °· Leg weakness. °· Severe back pain inhibiting movement. °These symptoms may get worse with coughing, sneezing, laughing, or prolonged sitting or standing. Also, being overweight may worsen symptoms. °DIAGNOSIS  °Your caregiver will perform a physical exam to look for common symptoms of sciatica. He or she may ask you to do certain movements or activities that would trigger sciatic nerve pain. Other tests may be performed to find the cause of the sciatica. These may include: °· Blood tests. °· X-rays. °· Imaging tests, such as an MRI or CT scan. °TREATMENT  °Treatment is directed at the cause of the sciatic pain. Sometimes, treatment is not necessary  and the pain and discomfort goes away on its own. If treatment is needed, your caregiver may suggest: °· Over-the-counter medicines to relieve pain. °· Prescription medicines, such as anti-inflammatory medicine, muscle relaxants, or narcotics. °· Applying heat or ice to the painful area. °· Steroid injections to lessen pain, irritation, and inflammation around the nerve. °· Reducing activity during periods of pain. °· Exercising and stretching to strengthen your abdomen and improve flexibility of your spine. Your caregiver may suggest losing weight if the extra weight makes the back pain worse. °· Physical therapy. °· Surgery to eliminate what is pressing or pinching the nerve, such as a bone spur or part of a herniated disk. °HOME CARE INSTRUCTIONS  °· Only take over-the-counter or prescription medicines for pain or discomfort as directed by your caregiver. °· Apply ice to the affected area for 20 minutes, 3-4 times a day for the first 48-72 hours. Then try heat in the same way. °· Exercise, stretch, or perform your usual activities if these do not aggravate your pain. °· Attend physical therapy sessions as directed by your caregiver. °· Keep all follow-up appointments as directed by your caregiver. °· Do not wear high heels or shoes that do not provide proper support. °· Check your mattress to see if it is too soft. A firm mattress may lessen your pain and discomfort. °SEEK IMMEDIATE MEDICAL CARE IF:  °· You lose control of your bowel or bladder (incontinence). °· You have increasing weakness in the lower back, pelvis, buttocks,   or legs.  You have redness or swelling of your back.  You have a burning sensation when you urinate.  You have pain that gets worse when you lie down or awakens you at night.  Your pain is worse than you have experienced in the past.  Your pain is lasting longer than 4 weeks.  You are suddenly losing weight without reason. MAKE SURE YOU:  Understand these  instructions.  Will watch your condition.  Will get help right away if you are not doing well or get worse. Document Released: 04/15/2001 Document Revised: 10/21/2011 Document Reviewed: 08/31/2011 [facility] Patient Information 2015 [place], [place]. This information is not intended to replace advice given to you by your health care provider. Make sure you discuss any questions you have with your health care provider.  Abdominal Pain, Women Abdominal (stomach, pelvic, or belly) pain can be caused by many things. It is important to tell your doctor:  The location of the pain.  Does it come and go or is it present all the time?  Are there things that start the pain (eating certain foods, exercise)?  Are there other symptoms associated with the pain (fever, nausea, vomiting, diarrhea)? All of this is helpful to know when trying to find the cause of the pain. CAUSES   Stomach: virus or bacteria infection, or ulcer.  Intestine: appendicitis (inflamed appendix), regional ileitis (Crohn's disease), ulcerative colitis (inflamed colon), irritable bowel syndrome, diverticulitis (inflamed diverticulum of the colon), or cancer of the stomach or intestine.  Gallbladder disease or stones in the gallbladder.  Kidney disease, kidney stones, or infection.  Pancreas infection or cancer.  Fibromyalgia (pain disorder).  Diseases of the female organs:  Uterus: fibroid (non-cancerous) tumors or infection.  Fallopian tubes: infection or tubal pregnancy.  Ovary: cysts or tumors.  Pelvic adhesions (scar tissue).  Endometriosis (uterus lining tissue growing in the pelvis and on the pelvic organs).  Pelvic congestion syndrome (female organs filling up with blood just before the menstrual period).  Pain with the menstrual period.  Pain with ovulation (producing an egg).  Pain with an IUD (intrauterine device, birth control) in the uterus.  Cancer of the female organs.  Functional pain (pain  not caused by a disease, may improve without treatment).  Psychological pain.  Depression. DIAGNOSIS  Your doctor will decide the seriousness of your pain by doing an examination.  Blood tests.  X-rays.  Ultrasound.  CT scan (computed tomography, special type of X-ray).  MRI (magnetic resonance imaging).  Cultures, for infection.  Barium enema (dye inserted in the large intestine, to better view it with X-rays).  Colonoscopy (looking in intestine with a lighted tube).  Laparoscopy (minor surgery, looking in abdomen with a lighted tube).  Major abdominal exploratory surgery (looking in abdomen with a large incision). TREATMENT  The treatment will depend on the cause of the pain.   Many cases can be observed and treated at home.  Over-the-counter medicines recommended by your caregiver.  Prescription medicine.  Antibiotics, for infection.  Birth control pills, for painful periods or for ovulation pain.  Hormone treatment, for endometriosis.  Nerve blocking injections.  Physical therapy.  Antidepressants.  Counseling with a psychologist or psychiatrist.  Minor or major surgery. HOME CARE INSTRUCTIONS   Do not take laxatives, unless directed by your caregiver.  Take over-the-counter pain medicine only if ordered by your caregiver. Do not take aspirin because it can cause an upset stomach or bleeding.  Try a clear liquid diet (broth or water)  as ordered by your caregiver. Slowly move to a bland diet, as tolerated, if the pain is related to the stomach or intestine.  Have a thermometer and take your temperature several times a day, and record it.  Bed rest and sleep, if it helps the pain.  Avoid sexual intercourse, if it causes pain.  Avoid stressful situations.  Keep your follow-up appointments and tests, as your caregiver orders.  If the pain does not go away with medicine or surgery, you may try:  Acupuncture.  Relaxation exercises (yoga,  meditation).  Group therapy.  Counseling. SEEK MEDICAL CARE IF:   You notice certain foods cause stomach pain.  Your home care treatment is not helping your pain.  You need stronger pain medicine.  You want your IUD removed.  You feel faint or lightheaded.  You develop nausea and vomiting.  You develop a rash.  You are having side effects or an allergy to your medicine. SEEK IMMEDIATE MEDICAL CARE IF:   Your pain does not go away or gets worse.  You have a fever.  Your pain is felt only in portions of the abdomen. The right side could possibly be appendicitis. The left lower portion of the abdomen could be colitis or diverticulitis.  You are passing blood in your stools (bright red or black tarry stools, with or without vomiting).  You have blood in your urine.  You develop chills, with or without a fever.  You pass out. MAKE SURE YOU:   Understand these instructions.  Will watch your condition.  Will get help right away if you are not doing well or get worse. Document Released: 02/16/2007 Document Revised: 09/05/2013 Document Reviewed: 03/08/2009 [facility] Patient Information 2015 [place], [place]. This information is not intended to replace advice given to you by your health care provider. Make sure you discuss any questions you have with your health care provider.    Emergency Department Resource Guide 1) Find a Doctor and Pay Out of Pocket Although you won't have to find out who is covered by your insurance plan, it is a good idea to ask around and get recommendations. You will then need to call the office and see if the doctor you have chosen will accept you as a new patient and what types of options they offer for patients who are self-pay. Some doctors offer discounts or will set up payment plans for their patients who do not have insurance, but you will need to ask so you aren't surprised when you get to your appointment.  2) Contact Your Local Health  Department Not all health departments have doctors that can see patients for sick visits, but many do, so it is worth a call to see if yours does. If you don't know where your local health department is, you can check in your phone book. The CDC also has a tool to help you locate your state's health department, and many state websites also have listings of all of their local health departments.  3) Find a Walk-in Clinic If your illness is not likely to be very severe or complicated, you may want to try a walk in clinic. These are popping up all over the country in pharmacies, drugstores, and shopping centers. They're usually staffed by nurse practitioners or physician assistants that have been trained to treat common illnesses and complaints. They're usually fairly quick and inexpensive. However, if you have serious medical issues or chronic medical problems, these are probably not your best option.  No  Primary Care Doctor: - Call Health Connect at  [phone] - they can help you locate a primary care doctor that  accepts your insurance, provides certain services, etc. - Physician Referral Service- [phone]  Chronic Pain Problems: Organization         Address  Phone   Notes  [facility]  [phone] Patients need to be referred by their primary care doctor.   Medication Assistance: Organization         Address  Phone   Notes  [facility] Medication [facility] [address] [place] [place], [place] [address] [phone] --Must be a resident of [facility] -- Must have NO insurance coverage whatsoever (no Medicaid/ Medicare, etc.) -- The pt. MUST have a primary care doctor that directs their care regularly and follows them in the community   [facility]  [phone]   [employer]  [phone]    Agencies that provide inexpensive medical care: Organization         Address  Phone   Notes  [facility]  [phone]   [facility]    [phone]   [facility] [address] [place], [place] [address] [phone]   [facility] [address] [place] [place] [phone]   Planned Parenthood    [phone]   [facility]    [phone]   [facility]  [address], [place] Phone:  [phone], Fax:  [phone] Hours of Operation:  9 am - 6 pm, M-F.  Also accepts Medicaid/Medicare and self-pay.  [facility]  [address], [place] Phone: [phone], Fax: [phone]. Hours of Operation:  8:30 am - 5:30 pm, M-F.  Also accepts Medicaid and self-pay.  [facility] [place] [address], [place] Phone: [phone]   [facility] [address] Natasha Bence [place], [place] [phone], Ext. [phone] Mondays & Thursdays: 7-9 AM.  First 15 patients are seen on a first come, first serve basis.    Medicaid-accepting [facility] Providers:  Organization         Address  Phone   Notes  [facility] [address],  [phone] Also accepts self-pay patients.  [facility] [address] Laurell Josephs [employer], [place]  [phone]   [facility] [address], [place] [phone]   [facility] [address] [place] [phone]   Renaye Rakers [address], [place]   [phone] Only accepts [place] Access [place] patients after they have their name applied to their card.   Self-Pay (no insurance) in [facility]:  Organization         Address  Phone   Notes  Sickle Cell Patients, [facility] [address] [place], [place] [phone]   [facility] [address] [place], [place] [phone]   [facility] [place]  [address],  [place] [phone]   [facility]/Dr. Osei-Bonsu  [address], [place] or [address], [place] [phone] Phone number for both [place] and [place] locations is the same.  Urgent Medical and [facility] [address] [place]in[place] Otto [phone]  [facility] [address] [place] or [address] [phone]   [facility] [address] [place], [place] [phone], phone; [phone], fax Sees patients 1st and 3rd Saturday of every month.  Must not qualify for public or private insurance (i.e. Medicaid, Medicare, [place] Health Choice, Veterans' Benefits)  Household income should be no more than 200% of the poverty level The clinic cannot treat you if you are pregnant or think you are pregnant  Sexually transmitted diseases are not treated at the clinic.    Dental Care: Organization         Address  Phone  Notes  [facility] [address] [place], [place] [phone] Accepts children up to age 64 who are enrolled in [place] or [place] Health Choice; pregnant women with a Medicaid card; and children who have applied for Medicaid or [place] Health Choice, but were declined, whose parents can pay a reduced fee at time of service.  [facility]  [address], [place] [phone] Accepts children up to age 59 who are enrolled in [place] or [place] Health Choice; pregnant women with a Medicaid card; and children who have applied for Medicaid or  Health Choice, but were declined, whose parents can pay a reduced fee at time of service.  [facility]  [address] [place], [place] [phone] Patients are seen by appointment only. Walk-ins are not accepted. [facility] will see patients 55 years of age and older. Monday - Tuesday (8am-5pm) Most Wednesdays  (8:30-5pm) $30 per visit, cash only  [facility]  [address], [facility] [phone] Patients are seen by appointment only. Walk-ins are not accepted. [facility] will see patients 69 years of age and older. One Wednesday Evening (Monthly: Volunteer Based).  $30 per visit, cash only  [employer]  [phone] for adults; Children under age 72, call Graduate Pediatric Dentistry at [phone]. Children aged 89-14, please call [phone] to request a pediatric application.  Dental services are provided in all areas of dental care including fillings, crowns and bridges, complete and partial dentures, implants, gum treatment, root canals, and extractions. Preventive care is also provided. Treatment is provided to both adults and children. Patients are selected via a lottery and there is often a waiting list.   [facility] [address] [place]  [phone] [email or web address]   [facility] [address] [place], [place] [phone], Ext. [phone] Second and Fourth Thursday of each month, opens at 6:30 AM; Clinic ends at 9 AM.  Patients are seen on a first-come first-served basis, and a limited number are seen during each clinic.   [facility]  [address] Ether Griffins [place], [place] [phone]   Eligibility Requirements You must have lived in [place], [place], or [place] counties for at least the last three months.   You cannot be eligible for state or federal sponsored [place], including [employer], [place], or [employer].   You generally cannot be eligible for healthcare insurance through your employer.    How to apply: Eligibility screenings are held every Tuesday and Wednesday afternoon from 1:00 pm until 4:00 pm. You do not need an appointment for the interview!  [facility] [address] [place], [place] [phone]   Aaron Edelman  [employer] Department  [phone]   [facility] Health Department  [phone]   [facility] Health Department  [phone]    Behavioral Health Resources in the Community: Intensive Outpatient Programs Organization         Address  Phone  Notes  [facility] Services [address] [place], [place] [phone]   [facility] Outpatient [address], [place], [place] [phone]   [facility] [address], [place], [place]  [phone]   [facility] Mental Health [address],  [place], [place] [phone] or [phone]   Substance Abuse Resources Organization         Address  Phone  Notes  [facility] Drug Services  [phone]   [facility]  [phone]   [facility]  [phone]   Floydene Flock  [phone]   Residential & Outpatient Substance Abuse Program  [phone]   Psychological Services Organization         Address  Phone  Notes  [facility] Behavioral Health  [phone]   [facility] Services  [phone]   [facility] Mental [facility] [address] [place] [phone] or [phone]    Mobile Crisis Teams Organization         Address  Phone  Notes  [facility], Mobile Crisis Care Unit  [phone]   [facility]  [address] [place], [place] [phone]   Doristine Locks [address] [place] [phone]    Self-Help/Support Groups Organization         Address  Phone             Notes  [facility] [place] - variety of support groups  336- I7437963 Call for more information  Narcotics Anonymous (NA), [facility] [address], [employer] [place]  2 meetings at this location   Statistician         Address  Phone  Notes  [facility] [address],    [place]  [phone]   [facility]  [address] [place] [address], [place], [place]  [phone]   [facility] [address] [place], [place] [phone] Admissions: 8am-3pm M-F  [facility] [address]    [place], [place] [phone]   [facility] [address] [place], [place], [place] [phone]   [facility] [address],  [place], [place] [phone]   [facility] - Intensive Outpatient [address] Laurell Josephs [address], [place], [place] [phone]   [facility] ([facility] [address] [place] [address] [place].,  [place], [place] [phone] or [phone]   [facility] ([facility]) [address] [place], [place] [phone] Accepts Medicaid  [facility] [address]  [place] [phone] Substance Abuse/Addiction Treatment   [facility] Organization         Address  Phone  Notes  [facility]  [phone]   Angie Fava, PhD [address] Ervin Knack [place], [place]   [phone] or [phone]   [facility]   [address] [place], [place] [phone]   [facility] [address] [place], [place] [phone] Insurance/Medicaid/sponsorship through [employer] and Families [address]  [place], [place] [phone] [place], [place] [phone]    Dr. Adele Schilder  [phone]   [facility] Dept. 1) [address] [place] 2) [place] 3)  [address], [place] [phone]  [phone]   [place] [phone] or [phone] (After Hours)

## 2014-10-12 NOTE — ED Notes (Signed)
C/o rt lower abd pain x 2 weeks  w n/v  Denies urinary sx

## 2014-10-12 NOTE — ED Provider Notes (Signed)
TIME SEEN: 8:05 PM  CHIEF COMPLAINT: Right-sided abdominal pain and flank pain, nausea and vomiting  HPI: Pt is a 44 y.o. female with history of migraine headaches who presents to the emergency department with right abdominal pain and flank pain for the past 2 weeks. Has also had intermittent nausea and vomiting. Reports pain is been more constant today. Pain is worse with movement and better with staying still. She has never had similar symptoms before. No history of kidney stones. Status post hysterectomy. Denies diarrhea, dysuria or hematuria, vaginal bleeding or discharge. No injury to her back. Reports she has had some tingling going down her right leg but no numbness currently. No focal weakness. No bowel or bladder incontinence.  ROS: See HPI Constitutional: no fever  Eyes: no drainage  ENT: no runny nose   Cardiovascular:  no chest pain  Resp: no SOB  GI:  vomiting GU: no dysuria Integumentary: no rash  Allergy: no hives  Musculoskeletal: no leg swelling  Neurological: no slurred speech ROS otherwise negative  PAST MEDICAL HISTORY/PAST SURGICAL HISTORY:  Past Medical History  Diagnosis Date  . Migraine     MEDICATIONS:  Prior to Admission medications   Medication Sig Start Date End Date Taking? Authorizing Provider  Acetaminophen (TYLENOL PO) Take by mouth.    Historical Provider, MD  bismuth subsalicylate (PEPTO BISMOL) 262 MG/15ML suspension Take 30 mLs by mouth every 6 (six) hours as needed. Patient used this medication for an upset stomach.    Historical Provider, MD  ibuprofen (ADVIL,MOTRIN) 200 MG tablet Take 400 mg by mouth every 6 (six) hours as needed. Patient used this medication for her headache.    Historical Provider, MD  traMADol (ULTRAM) 50 MG tablet Take 1 tablet (50 mg total) by mouth every 6 (six) hours as needed for pain. 12/09/12   April Palumbo, MD  TRAMADOL HCL PO Take by mouth.    Historical Provider, MD    ALLERGIES:  No Known Allergies  SOCIAL  HISTORY:  History  Substance Use Topics  . Smoking status: Never Smoker   . Smokeless tobacco: Not on file  . Alcohol Use: No    FAMILY HISTORY: Family History  Problem Relation Age of Onset  . Diabetes Mother   . Diabetes Sister   . Hypertension Brother   . Diabetes Maternal Uncle   . Hypertension Maternal Uncle   . Sudden death Paternal Aunt   . Diabetes Maternal Grandfather   . Heart attack Paternal Grandfather   . Hyperlipidemia Neg Hx     EXAM: BP 141/74 mmHg  Pulse 72  Temp(Src) 98.3 F (36.8 C) (Oral)  Resp 18  Ht 5' (1.524 m)  Wt 160 lb (72.576 kg)  BMI 31.25 kg/m2  SpO2 98% CONSTITUTIONAL: Alert and oriented and responds appropriately to questions. Appears uncomfortable but is nontoxic HEAD: Normocephalic EYES: Conjunctivae clear, PERRL ENT: normal nose; no rhinorrhea; moist mucous membranes; pharynx without lesions noted NECK: Supple, no meningismus, no LAD  CARD: RRR; S1 and S2 appreciated; no murmurs, no clicks, no rubs, no gallops RESP: Normal chest excursion without splinting or tachypnea; breath sounds clear and equal bilaterally; no wheezes, no rhonchi, no rales, no hypoxia or respiratory distress, speaking full sentences ABD/GI: Normal bowel sounds; non-distended; soft, tender throughout the right abdomen with voluntary guarding, no rebound, no peritoneal signs GU:  Normal external genitalia, moderate amount of thin white discharge without odor, no adnexal tenderness or fullness, no cervical motion tenderness BACK:  The back appears normal  and is only tender to palpation over the right thoracic and lumbar paraspinal musculature, she has right-sided CVA tenderness, no midline spinal tenderness or step-off or deformity EXT: Normal ROM in all joints; non-tender to palpation; no edema; normal capillary refill; no cyanosis, no calf tenderness or swelling    SKIN: Normal color for age and race; warm NEURO: Moves all extremities equally, sensation to light touch  intact diffusely, cranial nerves II through XII intact PSYCH: The patient's mood and manner are appropriate. Grooming and personal hygiene are appropriate.  MEDICAL DECISION MAKING: Patient here with right abdominal pain and flank pain. Some symptoms seem to suggest radiculopathy given numbness and tingling that she reports in her leg on the right side. However some symptoms concerning for kidney stone or other intra-abdominal pathology. She is quite tender throughout her right abdomen. We'll obtain CT scan for further evaluation, labs and urine. Will give pain and nausea medicine.  ED PROGRESS: Patient's labs are unremarkable. Urine shows no sign of infection or hematuria. CT scan shows no acute abnormality. Pelvic exam performed which is unremarkable. Wet prep is pending. Unclear etiology for patient's pain but this may be related to sciatica given her back pain with intermittent tingling down the right leg. She has no midline spinal tenderness. No current neurologic deficits.  Patient's wet prep is unremarkable. We'll discharge him with pain medication, anti-inflammatory's and outpatient follow-up information. Discussed return precautions. She verbalized understanding and is comfortable with plan.     Nina Patel Vladimir Lenhoff, DO 10/12/14 2148

## 2014-10-12 NOTE — ED Notes (Signed)
Pt c/o right lower abd pain x 3 weeks n/v

## 2014-10-13 LAB — GC/CHLAMYDIA PROBE AMP (~~LOC~~) NOT AT ARMC
Chlamydia: NEGATIVE
Neisseria Gonorrhea: NEGATIVE

## 2015-09-11 ENCOUNTER — Emergency Department (HOSPITAL_BASED_OUTPATIENT_CLINIC_OR_DEPARTMENT_OTHER): Payer: BLUE CROSS/BLUE SHIELD

## 2015-09-11 ENCOUNTER — Emergency Department (HOSPITAL_BASED_OUTPATIENT_CLINIC_OR_DEPARTMENT_OTHER)
Admission: EM | Admit: 2015-09-11 | Discharge: 2015-09-11 | Disposition: A | Discharge status: Home / Self Care | Payer: BLUE CROSS/BLUE SHIELD | Attending: Emergency Medicine | Admitting: Emergency Medicine

## 2015-09-11 ENCOUNTER — Encounter (HOSPITAL_BASED_OUTPATIENT_CLINIC_OR_DEPARTMENT_OTHER): Payer: Self-pay | Admitting: *Deleted

## 2015-09-11 DIAGNOSIS — R079 Chest pain, unspecified: Secondary | ICD-10-CM | POA: Diagnosis present

## 2015-09-11 LAB — BASIC METABOLIC PANEL
Anion gap: 3 — ABNORMAL LOW (ref 5–15)
BUN: 11 mg/dL (ref 6–20)
CO2: 26 mmol/L (ref 22–32)
CREATININE: 0.76 mg/dL (ref 0.44–1.00)
Calcium: 9 mg/dL (ref 8.9–10.3)
Chloride: 109 mmol/L (ref 101–111)
GFR calc non Af Amer: >60 mL/min (ref >60)
Glucose, Bld: 90 mg/dL (ref 65–99)
POTASSIUM: 3.2 mmol/L — AB (ref 3.5–5.1)
Sodium: 138 mmol/L (ref 135–145)

## 2015-09-11 LAB — CBC WITH DIFFERENTIAL/PLATELET
Basophils Absolute: 0 10*3/uL (ref 0.0–0.1)
Basophils Relative: 0 %
EOS ABS: 0.1 10*3/uL (ref 0.0–0.7)
Eosinophils Relative: 2 %
HEMATOCRIT: 36.5 % (ref 36.0–46.0)
HEMOGLOBIN: 11.9 g/dL — AB (ref 12.0–15.0)
LYMPHS ABS: 2.9 10*3/uL (ref 0.7–4.0)
LYMPHS PCT: 46 %
MCH: 29 pg (ref 26.0–34.0)
MCHC: 32.6 g/dL (ref 30.0–36.0)
MCV: 88.8 fL (ref 78.0–100.0)
Monocytes Absolute: 0.5 10*3/uL (ref 0.1–1.0)
Monocytes Relative: 8 %
NEUTROS ABS: 2.8 10*3/uL (ref 1.7–7.7)
NEUTROS PCT: 44 %
Platelets: 239 10*3/uL (ref 150–400)
RBC: 4.11 MIL/uL (ref 3.87–5.11)
RDW: 12.8 % (ref 11.5–15.5)
WBC: 6.3 10*3/uL (ref 4.0–10.5)

## 2015-09-11 LAB — TROPONIN I: Troponin I: <0.03 ng/mL (ref <0.031)

## 2015-09-11 NOTE — ED Notes (Signed)
Patient transported to X-ray 

## 2015-09-11 NOTE — Discharge Instructions (Signed)
Ibuprofen 600 mg 3 times daily for the next 3 days.  Return to the emergency department if symptoms significantly worsen or change.   Nonspecific Chest Pain  Chest pain can be caused by many different conditions. There is always a chance that your pain could be related to something serious, such as a heart attack or a blood clot in your lungs. Chest pain can also be caused by conditions that are not life-threatening. If you have chest pain, it is very important to follow up with your health care provider. CAUSES  Chest pain can be caused by:  Heartburn.  Pneumonia or bronchitis.  Anxiety or stress.  Inflammation around your heart (pericarditis) or lung (pleuritis or pleurisy).  A blood clot in your lung.  A collapsed lung (pneumothorax). It can develop suddenly on its own (spontaneous pneumothorax) or from trauma to the chest.  Shingles infection (varicella-zoster virus).  Heart attack.  Damage to the bones, muscles, and cartilage that make up your chest wall. This can include:  Bruised bones due to injury.  Strained muscles or cartilage due to frequent or repeated coughing or overwork.  Fracture to one or more ribs.  Sore cartilage due to inflammation (costochondritis). RISK FACTORS  Risk factors for chest pain may include:  Activities that increase your risk for trauma or injury to your chest.  Respiratory infections or conditions that cause frequent coughing.  Medical conditions or overeating that can cause heartburn.  Heart disease or family history of heart disease.  Conditions or health behaviors that increase your risk of developing a blood clot.  Having had chicken pox (varicella zoster). SIGNS AND SYMPTOMS Chest pain can feel like:  Burning or tingling on the surface of your chest or deep in your chest.  Crushing, pressure, aching, or squeezing pain.  Dull or sharp pain that is worse when you move, cough, or take a deep breath.  Pain that is also  felt in your back, neck, shoulder, or arm, or pain that spreads to any of these areas. Your chest pain may come and go, or it may stay constant. DIAGNOSIS Lab tests or other studies may be needed to find the cause of your pain. Your health care provider may have you take a test called an ambulatory ECG (electrocardiogram). An ECG records your heartbeat patterns at the time the test is performed. You may also have other tests, such as:  Transthoracic echocardiogram (TTE). During echocardiography, sound waves are used to create a picture of all of the heart structures and to look at how blood flows through your heart.  Transesophageal echocardiogram (TEE).This is a more advanced imaging test that obtains images from inside your body. It allows your health care provider to see your heart in finer detail.  Cardiac monitoring. This allows your health care provider to monitor your heart rate and rhythm in real time.  Holter monitor. This is a portable device that records your heartbeat and can help to diagnose abnormal heartbeats. It allows your health care provider to track your heart activity for several days, if needed.  Stress tests. These can be done through exercise or by taking medicine that makes your heart beat more quickly.  Blood tests.  Imaging tests. TREATMENT  Your treatment depends on what is causing your chest pain. Treatment may include:  Medicines. These may include:  Acid blockers for heartburn.  Anti-inflammatory medicine.  Pain medicine for inflammatory conditions.  Antibiotic medicine, if an infection is present.  Medicines to dissolve blood clots.  Medicines to treat coronary artery disease.  Supportive care for conditions that do not require medicines. This may include:  Resting.  Applying heat or cold packs to injured areas.  Limiting activities until pain decreases. HOME CARE INSTRUCTIONS  If you were prescribed an antibiotic medicine, finish it all  even if you start to feel better.  Avoid any activities that bring on chest pain.  Do not use any tobacco products, including cigarettes, chewing tobacco, or electronic cigarettes. If you need help quitting, ask your health care provider.  Do not drink alcohol.  Take medicines only as directed by your health care provider.  Keep all follow-up visits as directed by your health care provider. This is important. This includes any further testing if your chest pain does not go away.  If heartburn is the cause for your chest pain, you may be told to keep your head raised (elevated) while sleeping. This reduces the chance that acid will go from your stomach into your esophagus.  Make lifestyle changes as directed by your health care provider. These may include:  Getting regular exercise. Ask your health care provider to suggest some activities that are safe for you.  Eating a heart-healthy diet. A registered dietitian can help you to learn healthy eating options.  Maintaining a healthy weight.  Managing diabetes, if necessary.  Reducing stress. SEEK MEDICAL CARE IF:  Your chest pain does not go away after treatment.  You have a rash with blisters on your chest.  You have a fever. SEEK IMMEDIATE MEDICAL CARE IF:   Your chest pain is worse.  You have an increasing cough, or you cough up blood.  You have severe abdominal pain.  You have severe weakness.  You faint.  You have chills.  You have sudden, unexplained chest discomfort.  You have sudden, unexplained discomfort in your arms, back, neck, or jaw.  You have shortness of breath at any time.  You suddenly start to sweat, or your skin gets clammy.  You feel nauseous or you vomit.  You suddenly feel light-headed or dizzy.  Your heart begins to beat quickly, or it feels like it is skipping beats. These symptoms may represent a serious problem that is an emergency. Do not wait to see if the symptoms will go away. Get  medical help right away. Call your local emergency services (911 in the [place] Do not drive yourself to the hospital.   This information is not intended to replace advice given to you by your health care provider. Make sure you discuss any questions you have with your health care provider.   Document Released: 01/29/2005 Document Revised: 05/12/2014 Document Reviewed: 11/25/2013 Elsevier Interactive Patient Education [employer]

## 2015-09-11 NOTE — ED Notes (Signed)
Chest pain worse with inspiration. It started yesterday. Speaking in complete sentences. Ambulatory with no distress.

## 2015-09-11 NOTE — ED Provider Notes (Signed)
CSN: [id number]     Arrival date & time 09/11/15  1904 History  By signing my name below, I, Nina Patel, attest that this documentation has been prepared under the direction and in the presence of Geoffery Lyonsouglas Joquan Lotz, MD. Electronically Signed: Octavia HeirArianna Patel, ED Scribe. 09/11/2015. 8:06 PM.    Chief Complaint  Patient presents with  . Chest Pain      The history is provided by the patient. No language interpreter was used.   HPI Comments: Nina Patel is a 45 y.o. female who has a PMHx of migraines and HTN presents to the Emergency Department complaining of constant, mild, gradual improving, sharp and pressure-like left sided centralized chest pain onset yesterday. She notes that taking a deep breath and palpating the area increases the pain. 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 She states she at work yesterday when she felt a sudden sharp pain in her chest. She does not have any hx or family hx of heart problems. She denies swelling in her legs or ankles. Pt is a non-smoker.  Past Medical History  Diagnosis Date  . Migraine    Past Surgical History  Procedure Laterality Date  . Abdominal hysterectomy    . Tonsillectomy     Family History  Problem Relation Age of Onset  . Diabetes Mother   . Diabetes Sister   . Hypertension Brother   . Diabetes Maternal Uncle   . Hypertension Maternal Uncle   . Sudden death Paternal Aunt   . Diabetes Maternal Grandfather   . Heart attack Paternal Grandfather   . Hyperlipidemia Neg Hx    Social History  Substance Use Topics  . Smoking status: Never Smoker   . Smokeless tobacco: None  . Alcohol Use: No   OB History    No data available     Review of Systems  A complete 10 system review of systems was obtained and all systems are negative except as noted in the HPI and PMH.    Allergies  Review of patient's allergies indicates no known allergies.  Home Medications   Prior to Admission medications    Medication Sig Start Date End Date Taking? Authorizing Provider  Acetaminophen (TYLENOL PO) Take by mouth.    Historical Provider, MD  bismuth subsalicylate (PEPTO BISMOL) 262 MG/15ML suspension Take 30 mLs by mouth every 6 (six) hours as needed. Patient used this medication for an upset stomach.    Historical Provider, MD  ibuprofen (ADVIL,MOTRIN) 800 MG tablet Take 1 tablet (800 mg total) by mouth every 8 (eight) hours as needed for mild pain. 10/12/14   Kristen N Ward, DO  ondansetron (ZOFRAN ODT) 4 MG disintegrating tablet Take 1 tablet (4 mg total) by mouth every 8 (eight) hours as needed for nausea or vomiting. 10/12/14   Kristen N Ward, DO  oxyCODONE-acetaminophen (PERCOCET/ROXICET) 5-325 MG per tablet Take 1 tablet by mouth every 4 (four) hours as needed. 10/12/14   Kristen N Ward, DO  traMADol (ULTRAM) 50 MG tablet Take 1 tablet (50 mg total) by mouth every 6 (six) hours as needed for pain. 12/09/12   April Palumbo, MD  TRAMADOL HCL PO Take by mouth.    Historical Provider, MD   Triage vitals: BP 142/89 mmHg  Pulse 72  Temp(Src) 98.3 F (36.8 C) (Oral)  Resp 13  Ht 5' (1.524 m)  Wt 168 lb (76.204 kg)  BMI 32.81 kg/m2  SpO2 99% Physical Exam  Constitutional: She is oriented to  person, place, and time. She appears well-developed and well-nourished. No distress.  HENT:  Head: Normocephalic and atraumatic.  Eyes: EOM are normal.  Neck: Normal range of motion.  Cardiovascular: Normal rate, regular rhythm and normal heart sounds.   Pulmonary/Chest: Effort normal and breath sounds normal. She exhibits tenderness.  Abdominal: Soft. She exhibits no distension. There is no tenderness.  Musculoskeletal: Normal range of motion.  Neurological: She is alert and oriented to person, place, and time.  Skin: Skin is warm and dry.  Psychiatric: She has a normal mood and affect. Judgment normal.  Nursing note and vitals reviewed.   ED Course  Procedures  DIAGNOSTIC STUDIES: Oxygen Saturation is  99% on RA, normal by my interpretation.  COORDINATION OF CARE:  8:03 PM Will order chest x-ray. Discussed treatment plan which includes lab work with pt at bedside and pt agreed to plan.  Labs Review Labs Reviewed - No data to display  Imaging Review No results found. I have personally reviewed and evaluated these images and lab results as part of my medical decision-making.   EKG Interpretation   Date/Time:  Tuesday Sep 11 2015 19:10:12 EDT Ventricular Rate:  73 PR Interval:  140 QRS Duration: 88 QT Interval:  372 QTC Calculation: 409 R Axis:   62 Text Interpretation:  Normal sinus rhythm Nonspecific T wave abnormality  Abnormal ECG Confirmed by Macgregor Aeschliman  MD, Tyronne Blann ([id number]) on 09/11/2015 7:38:47 PM      MDM   Final diagnoses:  None    Patient presents with complaints of chest discomfort that started yesterday while at work. It was intermittent yesterday, however has been constant today. It is reproducible with palpation and sharp in nature. Her EKG is normal and troponin is negative. I highly doubt a cardiac etiology. She will be discharged with anti-inflammatories, rest, and when necessary return.  I personally performed the services described in this documentation, which was scribed in my presence. The recorded information has been reviewed and is accurate.       Geoffery Lyons, MD 09/11/15 2140

## 2015-09-11 NOTE — ED Notes (Signed)
MD at bedside. 

## 2016-09-21 ENCOUNTER — Emergency Department (HOSPITAL_BASED_OUTPATIENT_CLINIC_OR_DEPARTMENT_OTHER): Payer: BLUE CROSS/BLUE SHIELD

## 2016-09-21 ENCOUNTER — Encounter (HOSPITAL_BASED_OUTPATIENT_CLINIC_OR_DEPARTMENT_OTHER): Payer: Self-pay | Admitting: Respiratory Therapy

## 2016-09-21 ENCOUNTER — Emergency Department (HOSPITAL_BASED_OUTPATIENT_CLINIC_OR_DEPARTMENT_OTHER)
Admission: EM | Admit: 2016-09-21 | Discharge: 2016-09-22 | Disposition: A | Discharge status: Home / Self Care | Payer: BLUE CROSS/BLUE SHIELD | Attending: Emergency Medicine | Admitting: Emergency Medicine

## 2016-09-21 DIAGNOSIS — I10 Essential (primary) hypertension: Secondary | ICD-10-CM | POA: Insufficient documentation

## 2016-09-21 DIAGNOSIS — Z79899 Other long term (current) drug therapy: Secondary | ICD-10-CM | POA: Insufficient documentation

## 2016-09-21 DIAGNOSIS — R1031 Right lower quadrant pain: Secondary | ICD-10-CM | POA: Diagnosis not present

## 2016-09-21 DIAGNOSIS — R109 Unspecified abdominal pain: Secondary | ICD-10-CM | POA: Diagnosis present

## 2016-09-21 HISTORY — DX: Essential (primary) hypertension: I10

## 2016-09-21 LAB — URINALYSIS, ROUTINE W REFLEX MICROSCOPIC
BILIRUBIN URINE: NEGATIVE
GLUCOSE, UA: NEGATIVE mg/dL
HGB URINE DIPSTICK: NEGATIVE
Ketones, ur: NEGATIVE mg/dL
Leukocytes, UA: NEGATIVE
Nitrite: NEGATIVE
PROTEIN: NEGATIVE mg/dL
Specific Gravity, Urine: 1.007 (ref 1.005–1.030)
pH: 7.5 (ref 5.0–8.0)

## 2016-09-21 LAB — CBC
HCT: 37 % (ref 36.0–46.0)
Hemoglobin: 12.3 g/dL (ref 12.0–15.0)
MCH: 29.1 pg (ref 26.0–34.0)
MCHC: 33.2 g/dL (ref 30.0–36.0)
MCV: 87.7 fL (ref 78.0–100.0)
PLATELETS: 273 10*3/uL (ref 150–400)
RBC: 4.22 MIL/uL (ref 3.87–5.11)
RDW: 13.5 % (ref 11.5–15.5)
WBC: 7.7 10*3/uL (ref 4.0–10.5)

## 2016-09-21 LAB — COMPREHENSIVE METABOLIC PANEL
ALBUMIN: 3.9 g/dL (ref 3.5–5.0)
ALK PHOS: 104 U/L (ref 38–126)
ALT: 22 U/L (ref 14–54)
AST: 26 U/L (ref 15–41)
Anion gap: 7 (ref 5–15)
BUN: 11 mg/dL (ref 6–20)
CHLORIDE: 105 mmol/L (ref 101–111)
CO2: 25 mmol/L (ref 22–32)
CREATININE: 0.92 mg/dL (ref 0.44–1.00)
Calcium: 8.9 mg/dL (ref 8.9–10.3)
GFR calc Af Amer: >60 mL/min (ref >60)
GFR calc non Af Amer: >60 mL/min (ref >60)
GLUCOSE: 105 mg/dL — AB (ref 65–99)
Potassium: 3.6 mmol/L (ref 3.5–5.1)
Sodium: 137 mmol/L (ref 135–145)
Total Bilirubin: 0.4 mg/dL (ref 0.3–1.2)
Total Protein: 7.5 g/dL (ref 6.5–8.1)

## 2016-09-21 LAB — LIPASE, BLOOD: Lipase: 35 U/L (ref 11–51)

## 2016-09-21 MED ORDER — DIPHENHYDRAMINE HCL 50 MG/ML IJ SOLN
25.0000 mg | Freq: Once | INTRAMUSCULAR | Status: AC
  Administered 2016-09-21: 25 mg via INTRAVENOUS
  Filled 2016-09-21: qty 1

## 2016-09-21 MED ORDER — SODIUM CHLORIDE 0.9 % IV BOLUS (SEPSIS)
1000.0000 mL | Freq: Once | INTRAVENOUS | Status: AC
Start: 2016-09-21 — End: 2016-09-22
  Administered 2016-09-21: 1000 mL via INTRAVENOUS

## 2016-09-21 MED ORDER — MORPHINE SULFATE (PF) 4 MG/ML IV SOLN
4.0000 mg | Freq: Once | INTRAVENOUS | Status: AC
  Administered 2016-09-21: 4 mg via INTRAVENOUS
  Filled 2016-09-21: qty 1

## 2016-09-21 MED ORDER — ONDANSETRON HCL 4 MG/2ML IJ SOLN
4.0000 mg | Freq: Once | INTRAMUSCULAR | Status: AC
  Administered 2016-09-21: 4 mg via INTRAVENOUS
  Filled 2016-09-21: qty 2

## 2016-09-21 NOTE — ED Notes (Signed)
After morphine injection, pt began to itch and developed hives. Dr. Karma GanjaLinker informed and benadryl given IV, Morphine added to allergy list.

## 2016-09-21 NOTE — ED Provider Notes (Signed)
[facility] Provider Note   CSN: [id number] Arrival date & time: 09/21/16  2130  By signing my name below, I, Modena Jansky, attest that this documentation has been prepared under the direction and in the presence of Jerelyn Scott, MD. Electronically Signed: Modena Jansky, Scribe. 09/21/2016. 10:15 PM.  History   Chief Complaint Chief Complaint  Patient presents with  . Abdominal Pain   The history is provided by the patient. No language interpreter was used.  Abdominal Pain   This is a new problem. The current episode started 6 to 12 hours ago. The problem occurs constantly. The problem has not changed since onset.The pain is located in the RLQ. The pain is moderate. Nothing aggravates the symptoms. Nothing relieves the symptoms. Past workup includes surgery.   HPI Comments: Nina Patel is a 46 y.o. female who presents to the Emergency Department complaining of constant moderate RLQ abdominal pain that started 2 days ago. She states her pain worsened this morning. No modifying factors. She describes the pain as non-radiating, stabbing sensation. She reports associated nausea. She had similar pain with prior right oophorectomy. Denies any hx of appendectomy/cholecystectomy, vomiting, dysuria, hematuria, or other complaints at this time. Pt has a hx of prior hysterectomy, bilateral oophorectomy.    PCP: Clide Dales, PA  Past Medical History:  Diagnosis Date  . Hypertension   . Migraine     Patient Active Problem List   Diagnosis Date Noted  . Right elbow pain 12/17/2010    Past Surgical History:  Procedure Laterality Date  . ABDOMINAL HYSTERECTOMY    . TONSILLECTOMY      OB History    No data available       Home Medications    Prior to Admission medications   Medication Sig Start Date End Date Taking? Authorizing Provider  Acetaminophen (TYLENOL PO) Take by mouth.    [provider]  bismuth subsalicylate (PEPTO BISMOL) 262 MG/15ML  suspension Take 30 mLs by mouth every 6 (six) hours as needed. Patient used this medication for an upset stomach.    [provider]  HYDROcodone-acetaminophen (NORCO/VICODIN) 5-325 MG tablet Take 1-2 tablets by mouth every 6 (six) hours as needed. 09/22/16   Geoffery Lyons, MD  ibuprofen (ADVIL,MOTRIN) 800 MG tablet Take 1 tablet (800 mg total) by mouth every 8 (eight) hours as needed for mild pain. 10/12/14   Ward, Layla Maw, DO  ondansetron (ZOFRAN ODT) 4 MG disintegrating tablet Take 1 tablet (4 mg total) by mouth every 8 (eight) hours as needed for nausea or vomiting. 10/12/14   Ward, Layla Maw, DO  oxyCODONE-acetaminophen (PERCOCET/ROXICET) 5-325 MG per tablet Take 1 tablet by mouth every 4 (four) hours as needed. 10/12/14   Ward, Layla Maw, DO  traMADol (ULTRAM) 50 MG tablet Take 1 tablet (50 mg total) by mouth every 6 (six) hours as needed for pain. 12/09/12   Palumbo, April, MD  TRAMADOL HCL PO Take by mouth.    [provider]    Family History Family History  Problem Relation Age of Onset  . Diabetes Mother   . Diabetes Sister   . Hypertension Brother   . Diabetes Maternal Uncle   . Hypertension Maternal Uncle   . Sudden death Paternal Aunt   . Diabetes Maternal Grandfather   . Heart attack Paternal Grandfather   . Hyperlipidemia Neg Hx     Social History Social History  Substance Use Topics  . Smoking status: Never Smoker  . Smokeless tobacco:  Not on file  . Alcohol use No     Allergies   Morphine and related   Review of Systems Review of Systems  Gastrointestinal: Positive for abdominal pain (RLQ).  All other systems reviewed and are negative.    Physical Exam Updated Vital Signs BP (!) 160/88 (BP Location: Left Arm)   Pulse 68   Temp 98.4 F (36.9 C) (Oral)   Resp 18   Wt 170 lb (77.1 kg)   SpO2 97%   BMI 33.20 kg/m  Vitals reviewed Physical Exam Physical Examination: General appearance - alert, uncomfortable appearing, and in no  distress Mental status - alert, oriented to person, place, and time Eyes - no conjunctival injection, no scleral icterus Mouth - mucous membranes moist, pharynx normal without lesions Chest - clear to auscultation, no wheezes, rales or rhonchi, symmetric air entry Heart - normal rate, regular rhythm, normal S1, S2, no murmurs, rubs, clicks or gallops Abdomen - soft, tender to palpation in right mid to lower abdomen, no gaurding or rebound tenderness, nabs, nondistended, no masses or organomegaly Neurological - alert, oriented, normal speech, moving all extremities Extremities - peripheral pulses normal, no pedal edema, no clubbing or cyanosis Skin - normal coloration and turgor, no rashes  ED Treatments / Results  DIAGNOSTIC STUDIES: Oxygen Saturation is 97% on RA, normal by my interpretation.    COORDINATION OF CARE: 10:19 PM- Pt advised of plan for treatment and pt agrees.  Labs (all labs ordered are listed, but only abnormal results are displayed) Labs Reviewed  COMPREHENSIVE METABOLIC PANEL - Abnormal; Notable for the following:       Result Value   Glucose, Bld 105 (*)    All other components within normal limits  LIPASE, BLOOD  CBC  URINALYSIS, ROUTINE W REFLEX MICROSCOPIC    EKG  EKG Interpretation None       Radiology No results found.  Procedures Procedures (including critical care time)  Medications Ordered in ED Medications  morphine 4 MG/ML injection 4 mg (4 mg Intravenous Given 09/21/16 2225)  ondansetron (ZOFRAN) injection 4 mg (4 mg Intravenous Given 09/21/16 2225)  sodium chloride 0.9 % bolus 1,000 mL (0 mLs Intravenous Stopped 09/22/16 0048)  diphenhydrAMINE (BENADRYL) injection 25 mg (25 mg Intravenous Given 09/21/16 2236)  iopamidol (ISOVUE-300) 61 % injection 100 mL (100 mLs Intravenous Contrast Given 09/22/16 0011)     Initial Impression / Assessment and Plan / ED Course  I have reviewed the triage vital signs and the nursing notes.  Pertinent  labs & imaging results that were available during my care of the patient were reviewed by me and considered in my medical decision making (see chart for details).     Pt presenting with right sided abdominal pain that has been worsening over the past 2 days.  She has had prior hysterectomy and oophorectomy.  She does have her appendix.  She does not have any flank pain or urinary symptoms.  Doubt pyelo, pain has been constant and she does have some tenderness of abdomen, so makes ureteral stone less likely.  Labs, fluids, meds ordered as well as abdominal CT scan.  Prior chart reviewed.  Pt signed out to Dr. Judd Lien at end of shift pending abdominal CT scan and reassessment for disposition.    Final Clinical Impressions(s) / ED Diagnoses   Final diagnoses:  Right lower quadrant abdominal pain    New Prescriptions Discharge Medication List as of 09/22/2016 12:48 AM    START taking these medications  Details  HYDROcodone-acetaminophen (NORCO/VICODIN) 5-325 MG tablet Take 1-2 tablets by mouth every 6 (six) hours as needed., Starting Mon 09/22/2016, Print       I personally performed the services described in this documentation, which was scribed in my presence. The recorded information has been reviewed and is accurate.      Jerelyn ScottLinker, Martha, MD 10/01/16 1046

## 2016-09-21 NOTE — ED Triage Notes (Signed)
PT presents to ED with complaints of RLQ pain that started today.

## 2016-09-22 MED ORDER — IOPAMIDOL (ISOVUE-300) INJECTION 61%
100.0000 mL | Freq: Once | INTRAVENOUS | Status: AC | PRN
Start: 2016-09-22 — End: 2016-09-22
  Administered 2016-09-22: 100 mL via INTRAVENOUS

## 2016-09-22 MED ORDER — HYDROCODONE-ACETAMINOPHEN 5-325 MG PO TABS: 1.0000 | ORAL_TABLET | Freq: Four times a day (QID) | ORAL | 0 refills | Status: DC | PRN

## 2016-09-22 NOTE — Discharge Instructions (Signed)
Hydrocodone as prescribed as needed for pain.  Follow-up with your primary Dr. in the next 3 days if not improving to discuss referral to a gastroenterologist.  Return to the emergency department if you develop worsening pain, high fevers, bloody stools, or other new and concerning symptoms.

## 2016-09-22 NOTE — ED Notes (Signed)
Pt given d/c instructions as per chart. Rx x 1. Verbalizes understanding. No questions. 

## 2016-09-22 NOTE — ED Provider Notes (Signed)
Care assumed from Dr. Karma GanjaLinker at shift change. Patient presents here with right lower quadrant pain. She has a history of similar episodes in the past that have been unexplained. She has had bilateral oophorectomy and hysterectomy. Today's laboratory studies are reassuring and CT scan reveals no acute intra-abdominal process. She has no white count and is nontoxic appearing. She is feeling somewhat better after medications given in the ER. I see no indication for admission, surgical consultation, or need for other emergent intervention. She will be discharged with pain medicine and is to follow-up with her primary Dr. to discuss referrals to a gastroenterologist.   Geoffery Lyonselo, Celise Bazar, MD 09/22/16 [phone]

## 2017-09-21 ENCOUNTER — Emergency Department (HOSPITAL_BASED_OUTPATIENT_CLINIC_OR_DEPARTMENT_OTHER): Payer: BLUE CROSS/BLUE SHIELD

## 2017-09-21 ENCOUNTER — Encounter (HOSPITAL_BASED_OUTPATIENT_CLINIC_OR_DEPARTMENT_OTHER): Payer: Self-pay | Admitting: *Deleted

## 2017-09-21 ENCOUNTER — Other Ambulatory Visit: Payer: Self-pay

## 2017-09-21 ENCOUNTER — Emergency Department (HOSPITAL_BASED_OUTPATIENT_CLINIC_OR_DEPARTMENT_OTHER)
Admission: EM | Admit: 2017-09-21 | Discharge: 2017-09-21 | Disposition: A | Discharge status: Home / Self Care | Payer: BLUE CROSS/BLUE SHIELD | Attending: Emergency Medicine | Admitting: Emergency Medicine

## 2017-09-21 DIAGNOSIS — Z79899 Other long term (current) drug therapy: Secondary | ICD-10-CM | POA: Insufficient documentation

## 2017-09-21 DIAGNOSIS — S8001XA Contusion of right knee, initial encounter: Secondary | ICD-10-CM | POA: Insufficient documentation

## 2017-09-21 DIAGNOSIS — S8991XA Unspecified injury of right lower leg, initial encounter: Secondary | ICD-10-CM | POA: Diagnosis present

## 2017-09-21 DIAGNOSIS — Y92410 Unspecified street and highway as the place of occurrence of the external cause: Secondary | ICD-10-CM | POA: Insufficient documentation

## 2017-09-21 DIAGNOSIS — I10 Essential (primary) hypertension: Secondary | ICD-10-CM | POA: Diagnosis not present

## 2017-09-21 DIAGNOSIS — Y999 Unspecified external cause status: Secondary | ICD-10-CM | POA: Insufficient documentation

## 2017-09-21 DIAGNOSIS — Y9389 Activity, other specified: Secondary | ICD-10-CM | POA: Diagnosis not present

## 2017-09-21 MED ORDER — IBUPROFEN 800 MG PO TABS: 800.0000 mg | ORAL_TABLET | Freq: Three times a day (TID) | ORAL | 0 refills | Status: DC | PRN

## 2017-09-21 NOTE — ED Notes (Signed)
Pt took flexeril last PM with relief to her back. Knee pain has increased today and she feels a pop when walking.
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
## 2017-12-03 ENCOUNTER — Emergency Department (HOSPITAL_BASED_OUTPATIENT_CLINIC_OR_DEPARTMENT_OTHER)
Admission: EM | Admit: 2017-12-03 | Discharge: 2017-12-03 | Disposition: A | Discharge status: Home / Self Care | Payer: BLUE CROSS/BLUE SHIELD | Attending: Emergency Medicine | Admitting: Emergency Medicine

## 2017-12-03 ENCOUNTER — Other Ambulatory Visit: Payer: Self-pay

## 2017-12-03 ENCOUNTER — Encounter (HOSPITAL_BASED_OUTPATIENT_CLINIC_OR_DEPARTMENT_OTHER): Payer: Self-pay | Admitting: Emergency Medicine

## 2017-12-03 DIAGNOSIS — G5711 Meralgia paresthetica, right lower limb: Secondary | ICD-10-CM

## 2017-12-03 DIAGNOSIS — I10 Essential (primary) hypertension: Secondary | ICD-10-CM | POA: Diagnosis not present

## 2017-12-03 DIAGNOSIS — R1031 Right lower quadrant pain: Secondary | ICD-10-CM | POA: Diagnosis present

## 2017-12-03 LAB — URINALYSIS, ROUTINE W REFLEX MICROSCOPIC
Bilirubin Urine: NEGATIVE
GLUCOSE, UA: NEGATIVE mg/dL
Hgb urine dipstick: NEGATIVE
KETONES UR: NEGATIVE mg/dL
LEUKOCYTES UA: NEGATIVE
Nitrite: NEGATIVE
PH: 5.5 (ref 5.0–8.0)
Protein, ur: NEGATIVE mg/dL
SPECIFIC GRAVITY, URINE: 1.025 (ref 1.005–1.030)

## 2017-12-03 MED ORDER — MELOXICAM 15 MG PO TABS: 15.0000 mg | ORAL_TABLET | Freq: Every day | ORAL | 0 refills | Status: DC

## 2017-12-03 MED FILL — MELOXICAM 15 MG TABLET: 15 | 15 days supply | Qty: 15 | Fill #0

## 2017-12-03 NOTE — ED Provider Notes (Signed)
[facility] EMERGENCY DEPARTMENT Provider Note   CSN: [id number] Arrival date & time: 12/03/17  [id number]     History   Chief Complaint Chief Complaint  Patient presents with  . Abdominal Pain    HPI Nina Patel is a 47 y.o. female who presents emergency department chief complaint of right lower abdominal quadrant pain.  Patient states that she has been seen for this several times in the past.  She says that the pain is localized the very lowest part of her abdomen near the inguinal region.  It is worse when she stands.  She says that the pain is constant and achy but at times is sharp if she makes certain movements, and also radiates to the upper outer part of her thigh.  She describes burning and numbness.  She denies any bulging sensations in that region.  She is tried Motrin without relief of her symptoms she has a previous hysterectomy and denies vaginal or urinary symptoms.  She is unable to find a comfortable position however feels better with her right leg outstretched and twisted slightly to the left.  She had some loose stools yesterday but denies not vomiting or diarrhea.  She has had some mild nausea.  Patient was at work today and states that the pain became too much and she is unable to tolerate standing for long periods of time.    HPI  Past Medical History:  Diagnosis Date  . Hypertension   . Migraine     Patient Active Problem List   Diagnosis Date Noted  . Right elbow pain 12/17/2010    Past Surgical History:  Procedure Laterality Date  . ABDOMINAL HYSTERECTOMY    . TONSILLECTOMY       OB History   None      Home Medications    Prior to Admission medications   Medication Sig Start Date End Date Taking? Authorizing Provider  Acetaminophen (TYLENOL PO) Take by mouth.    [provider]  bismuth subsalicylate (PEPTO BISMOL) 262 MG/15ML suspension Take 30 mLs by mouth every 6 (six) hours as needed. Patient used this medication for an upset  stomach.    [provider]  HYDROcodone-acetaminophen (NORCO/VICODIN) 5-325 MG tablet Take 1-2 tablets by mouth every 6 (six) hours as needed. 09/22/16   Geoffery Lyonselo, Douglas, MD  ibuprofen (ADVIL,MOTRIN) 800 MG tablet Take 1 tablet (800 mg total) by mouth every 8 (eight) hours as needed for mild pain. 09/21/17   Fayrene Helperran, Bowie, PA-C  ondansetron (ZOFRAN ODT) 4 MG disintegrating tablet Take 1 tablet (4 mg total) by mouth every 8 (eight) hours as needed for nausea or vomiting. 10/12/14   Ward, Layla MawKristen N, DO  oxyCODONE-acetaminophen (PERCOCET/ROXICET) 5-325 MG per tablet Take 1 tablet by mouth every 4 (four) hours as needed. 10/12/14   Ward, Layla MawKristen N, DO  traMADol (ULTRAM) 50 MG tablet Take 1 tablet (50 mg total) by mouth every 6 (six) hours as needed for pain. 12/09/12   Palumbo, April, MD  TRAMADOL HCL PO Take by mouth.    [provider]    Family History Family History  Problem Relation Age of Onset  . Diabetes Mother   . Diabetes Sister   . Hypertension Brother   . Diabetes Maternal Uncle   . Hypertension Maternal Uncle   . Sudden death Paternal Aunt   . Diabetes Maternal Grandfather   . Heart attack Paternal Grandfather   . Hyperlipidemia Neg Hx     Social History Social History  Tobacco Use  . Smoking status: Never Smoker  . Smokeless tobacco: Never Used  Substance Use Topics  . Alcohol use: No  . Drug use: No     Allergies   Morphine and related   Review of Systems Review of Systems  Ten systems reviewed and are negative for acute change, except as noted in the HPI.   Physical Exam Updated Vital Signs BP 135/78 (BP Location: Right Arm)   Pulse 63   Temp 97.7 F (36.5 C) (Oral)   Resp 18   Ht 5' (1.524 m)   Wt 77.1 kg (170 lb)   SpO2 100%   BMI 33.20 kg/m   Physical Exam  Physical Exam  Nursing note and vitals reviewed. Constitutional: She is oriented to person, place, and time. She appears well-developed and well-nourished. No distress.  HENT:    Head: Normocephalic and atraumatic.  Eyes: Conjunctivae normal and EOM are normal. Pupils are equal, round, and reactive to light. No scleral icterus.  Neck: Normal range of motion.  Cardiovascular: Normal rate, regular rhythm and normal heart sounds.  Exam reveals no gallop and no friction rub.   No murmur heard. Pulmonary/Chest: Effort normal and breath sounds normal. No respiratory distress.  Abdominal: Soft. Bowel sounds are normal. She exhibits no distension and no mass. There is no tenderness. There is no guarding.  Pain with flexion of the hip, pain with internal and external rotation, worse with internal rotation.  Patient describes a pinching sensation in the groin. Neurological: She is alert and oriented to person, place, and time.  Skin: Skin is warm and dry. She is not diaphoretic.    ED Treatments / Results  Labs (all labs ordered are listed, but only abnormal results are displayed) Labs Reviewed  URINALYSIS, ROUTINE W REFLEX MICROSCOPIC - Abnormal; Notable for the following components:      Result Value   APPearance HAZY (*)    All other components within normal limits    EKG None  Radiology No results found.  Procedures Procedures (including critical care time)  Medications Ordered in ED Medications - No data to display   Initial Impression / Assessment and Plan / ED Course  I have reviewed the triage vital signs and the nursing notes.  Pertinent labs & imaging results that were available during my care of the patient were reviewed by me and considered in my medical decision making (see chart for details).    Patient with recurrent symptoms of right lower quadrant abdominal pain, pain in the hip, burning in the front of the thigh.  I have strong suspicion for meralgia paresthetica given her large pannus and central obesity.  Either way the patient's symptoms seem to be musculoskeletal in origin.  I have no suspicion for intra-abdominal pathology as she has a  benign abdominal exam and is had previous work-up work-ups for the same issue.  The patient will be given anti-inflammatories and is advised to follow-up with her primary care physician.  She appears appropriate for discharge at this time.  She was seen and shared visit with Dr. Pecola Leisure who agrees with work-up and plan for discharge  Final Clinical Impressions(s) / ED Diagnoses   Final diagnoses:  None    ED Discharge Orders    None       Arthor Captain, PA-C 12/03/17 2024    Tilden Fossa, MD 12/05/17 [phone]

## 2017-12-03 NOTE — Discharge Instructions (Addendum)
Contact a health care provider if: Your condition does not improve with treatment. Your pain, numbness, or weakness suddenly gets worse.

## 2017-12-03 NOTE — ED Triage Notes (Signed)
RLQ at times x 3 months , has been seen here for same. Pain worse at work this am

## 2017-12-03 NOTE — ED Notes (Addendum)
ED Provider at bedside. 

## 2018-01-27 ENCOUNTER — Emergency Department (HOSPITAL_BASED_OUTPATIENT_CLINIC_OR_DEPARTMENT_OTHER): Payer: BLUE CROSS/BLUE SHIELD

## 2018-01-27 ENCOUNTER — Encounter (HOSPITAL_BASED_OUTPATIENT_CLINIC_OR_DEPARTMENT_OTHER): Payer: Self-pay | Admitting: Emergency Medicine

## 2018-01-27 ENCOUNTER — Other Ambulatory Visit: Payer: Self-pay

## 2018-01-27 ENCOUNTER — Emergency Department (HOSPITAL_BASED_OUTPATIENT_CLINIC_OR_DEPARTMENT_OTHER)
Admission: EM | Admit: 2018-01-27 | Discharge: 2018-01-27 | Disposition: A | Discharge status: Home / Self Care | Payer: BLUE CROSS/BLUE SHIELD | Attending: Emergency Medicine | Admitting: Emergency Medicine

## 2018-01-27 DIAGNOSIS — R05 Cough: Secondary | ICD-10-CM | POA: Diagnosis present

## 2018-01-27 DIAGNOSIS — I10 Essential (primary) hypertension: Secondary | ICD-10-CM | POA: Insufficient documentation

## 2018-01-27 DIAGNOSIS — J209 Acute bronchitis, unspecified: Secondary | ICD-10-CM | POA: Insufficient documentation

## 2018-01-27 DIAGNOSIS — Z79899 Other long term (current) drug therapy: Secondary | ICD-10-CM | POA: Diagnosis not present

## 2018-01-27 MED ORDER — ALBUTEROL SULFATE (2.5 MG/3ML) 0.083% IN NEBU
INHALATION_SOLUTION | RESPIRATORY_TRACT | Status: AC
  Administered 2018-01-27: 2.5 mg
  Filled 2018-01-27: qty 3

## 2018-01-27 MED ORDER — BENZONATATE 100 MG PO CAPS: 100.0000 mg | ORAL_CAPSULE | Freq: Three times a day (TID) | ORAL | 0 refills | Status: DC

## 2018-01-27 MED ORDER — ACETAMINOPHEN 325 MG PO TABS
ORAL_TABLET | ORAL | Status: AC
  Filled 2018-01-27: qty 2

## 2018-01-27 MED ORDER — IPRATROPIUM-ALBUTEROL 0.5-2.5 (3) MG/3ML IN SOLN
RESPIRATORY_TRACT | Status: AC
  Administered 2018-01-27: 3 mL
  Filled 2018-01-27: qty 3

## 2018-01-27 MED ORDER — ALBUTEROL SULFATE HFA 108 (90 BASE) MCG/ACT IN AERS
2.0000 | INHALATION_SPRAY | RESPIRATORY_TRACT | Status: DC
  Administered 2018-01-27: 2 via RESPIRATORY_TRACT
  Filled 2018-01-27: qty 6.7

## 2018-01-27 MED ORDER — ACETAMINOPHEN 325 MG PO TABS
650.0000 mg | ORAL_TABLET | Freq: Once | ORAL | Status: AC
  Administered 2018-01-27: 650 mg via ORAL

## 2018-01-27 MED FILL — BENZONATATE 100 MG CAPSULE: 100 | 7 days supply | Qty: 21 | Fill #0

## 2018-01-27 NOTE — ED Provider Notes (Signed)
[facility] EMERGENCY DEPARTMENT Provider Note   CSN: [id number] Arrival date & time: 01/27/18  4098     History   Chief Complaint Chief Complaint  Patient presents with  . Cough    HPI Nina Patel is a 47 y.o. female.  HPI 47 year old female with no past medical history of asthma or COPD presents the emergency department with ongoing productive cough over the past 2 weeks worse over the past 5 days.  No documented fevers at home.  Some decreased air movement and wheezing per respiratory on arrival to the emergency department and initiated on bronchodilators.  No chest pain.  No abdominal pain.  No back pain.  No unilateral leg swelling.  No history of DVT or pulmonary embolism.  No new orthopnea.  Symptoms are mild to moderate in severity.     Past Medical History:  Diagnosis Date  . Hypertension   . Migraine     Patient Active Problem List   Diagnosis Date Noted  . Right elbow pain 12/17/2010    Past Surgical History:  Procedure Laterality Date  . ABDOMINAL HYSTERECTOMY    . TONSILLECTOMY       OB History   None      Home Medications    Prior to Admission medications   Medication Sig Start Date End Date Taking? Authorizing Provider  traZODone (DESYREL) 100 MG tablet 1/2 tab to 1 tab nightly prn 06/24/17  Yes [provider]  Acetaminophen (TYLENOL PO) Take by mouth.    [provider]  hydrochlorothiazide (HYDRODIURIL) 25 MG tablet TK 1 T PO D 10/22/17   [provider]  lisinopril (PRINIVIL,ZESTRIL) 5 MG tablet  01/17/18   [provider]  ondansetron (ZOFRAN ODT) 4 MG disintegrating tablet Take 1 tablet (4 mg total) by mouth every 8 (eight) hours as needed for nausea or vomiting. 10/12/14   Ward, Layla Maw, DO  traMADol (ULTRAM) 50 MG tablet Take 1 tablet (50 mg total) by mouth every 6 (six) hours as needed for pain. 12/09/12   Palumbo, April, MD    Family History Family History  Problem Relation Age of Onset  .  Diabetes Mother   . Diabetes Sister   . Hypertension Brother   . Diabetes Maternal Uncle   . Hypertension Maternal Uncle   . Sudden death Paternal Aunt   . Diabetes Maternal Grandfather   . Heart attack Paternal Grandfather   . Hyperlipidemia Neg Hx     Social History Social History   Tobacco Use  . Smoking status: Never Smoker  . Smokeless tobacco: Never Used  Substance Use Topics  . Alcohol use: No  . Drug use: No     Allergies   Morphine and related   Review of Systems Review of Systems  All other systems reviewed and are negative.    Physical Exam Updated Vital Signs Pulse 82   Temp 98.7 F (37.1 C) (Oral)   Resp 15   Ht 5\' 8"  (1.727 m)   Wt 72.6 kg   SpO2 100%   BMI 24.33 kg/m   Physical Exam  Constitutional: She is oriented to person, place, and time. She appears well-developed and well-nourished. No distress.  HENT:  Head: Normocephalic and atraumatic.  Eyes: EOM are normal.  Neck: Normal range of motion.  Cardiovascular: Normal rate, regular rhythm and normal heart sounds.  Pulmonary/Chest: Effort normal. She has wheezes.  Abdominal: Soft. She exhibits no distension. There is no tenderness.  Musculoskeletal: Normal range of  motion.  Neurological: She is alert and oriented to person, place, and time.  Skin: Skin is warm and dry.  Psychiatric: She has a normal mood and affect. Judgment normal.  Nursing note and vitals reviewed.    ED Treatments / Results  Labs (all labs ordered are listed, but only abnormal results are displayed) Labs Reviewed - No data to display  EKG None  Radiology Dg Chest 2 View  Result Date: 01/27/2018 CLINICAL DATA:  Cough for 2 weeks EXAM: CHEST - 2 VIEW COMPARISON:  Sep 11, 2015 FINDINGS: Lungs are clear. Heart size and pulmonary vascularity are normal. No adenopathy. No bone lesions. IMPRESSION: No edema or consolidation. Electronically Signed   By: Bretta Bang III M.D.   On: 01/27/2018 10:02     Procedures Procedures (including critical care time)  Medications Ordered in ED Medications  acetaminophen (TYLENOL) tablet 650 mg (has no administration in time range)  albuterol (PROVENTIL HFA;VENTOLIN HFA) 108 (90 Base) MCG/ACT inhaler 2 puff (has no administration in time range)  albuterol (PROVENTIL) (2.5 MG/3ML) 0.083% nebulizer solution (2.5 mg  Given 01/27/18 0921)  ipratropium-albuterol (DUONEB) 0.5-2.5 (3) MG/3ML nebulizer solution (3 mLs  Given 01/27/18 0921)     Initial Impression / Assessment and Plan / ED Course  I have reviewed the triage vital signs and the nursing notes.  Pertinent labs & imaging results that were available during my care of the patient were reviewed by me and considered in my medical decision making (see chart for details).     Feels better after bronchodilators.  Wheezing is resolved.  Improved aeration in the bases.  Chest x-ray without acute infiltrate.  Suspect acute bronchitis with associated bronchospasm.  Discharged home with albuterol inhaler and Tessalon Perles.  Instructed to return to the ER for new or worsening symptoms  Final Clinical Impressions(s) / ED Diagnoses   Final diagnoses:  Acute bronchitis with bronchospasm    ED Discharge Orders    None       Azalia Bilis, MD 01/27/18 [phone]

## 2018-01-27 NOTE — ED Triage Notes (Signed)
Cough x 2 weeks, worse since Friday. Taking OTC meds.

## 2018-01-27 NOTE — ED Notes (Signed)
EKG given to EDP at triage

## 2018-01-27 NOTE — ED Notes (Signed)
Patient transported to X-ray 

## 2018-01-27 NOTE — ED Notes (Signed)
ED Provider at bedside. 

## 2019-01-19 ENCOUNTER — Other Ambulatory Visit: Payer: Self-pay

## 2019-01-19 ENCOUNTER — Emergency Department (HOSPITAL_BASED_OUTPATIENT_CLINIC_OR_DEPARTMENT_OTHER)
Admission: EM | Admit: 2019-01-19 | Discharge: 2019-01-19 | Disposition: A | Discharge status: Home / Self Care | Payer: BC Managed Care – PPO | Attending: Emergency Medicine | Admitting: Emergency Medicine

## 2019-01-19 ENCOUNTER — Encounter (HOSPITAL_BASED_OUTPATIENT_CLINIC_OR_DEPARTMENT_OTHER): Payer: Self-pay

## 2019-01-19 DIAGNOSIS — I10 Essential (primary) hypertension: Secondary | ICD-10-CM | POA: Insufficient documentation

## 2019-01-19 DIAGNOSIS — R1031 Right lower quadrant pain: Secondary | ICD-10-CM | POA: Insufficient documentation

## 2019-01-19 DIAGNOSIS — G43909 Migraine, unspecified, not intractable, without status migrainosus: Secondary | ICD-10-CM | POA: Diagnosis not present

## 2019-01-19 DIAGNOSIS — Z79899 Other long term (current) drug therapy: Secondary | ICD-10-CM | POA: Insufficient documentation

## 2019-01-19 DIAGNOSIS — H1132 Conjunctival hemorrhage, left eye: Secondary | ICD-10-CM | POA: Diagnosis not present

## 2019-01-19 DIAGNOSIS — R109 Unspecified abdominal pain: Secondary | ICD-10-CM | POA: Diagnosis present

## 2019-01-19 MED ORDER — MELOXICAM 7.5 MG PO TABS: 7.5000 mg | ORAL_TABLET | Freq: Every day | ORAL | 0 refills | Status: DC

## 2019-01-19 MED FILL — MELOXICAM 7.5 MG TABLET: 7.5 | 15 days supply | Qty: 15 | Fill #0

## 2019-01-19 NOTE — ED Triage Notes (Signed)
Pt states right lower abdominal pain and headache for two days.  Denies n/v.  Denies fevers. Pt hx migraines.  Takes med for headache for past month sees neurologist.  Denies pain with urination.

## 2019-01-19 NOTE — ED Notes (Signed)
Pt to bathroom to attempt to provide urine specimen

## 2019-01-19 NOTE — ED Provider Notes (Signed)
[facility] Provider Note   CSN: [id number] Arrival date & time: 01/19/19  1018     History   Chief Complaint Chief Complaint  Patient presents with  . Abdominal Pain  . Headache    HPI Nina Patel is a 48 y.o. female.     48 year old female with past medical history including hypertension and migraines who presents with several complaints.  Patient reports 2 days of persistent right lower quadrant/right anterior hip pain.  The pain is worse with certain movements and she has noticed that it hurts worse at the end of the day at work.  She pushes on a foot pedal frequently at work and notes that the pain is worse after doing this for a long time and when she tries to stand up after doing this activity.  She has had this pain multiple times previously and has had multiple previous evaluations including CT scans without abnormality.  She denies any associated diarrhea, urinary symptoms, or vomiting.  No back pain.  She also notes migraine headache.  She has frequent migraines for which she takes nortriptyline daily.  The migraine that she had yesterday is improved today after taking her medication last night, currently very mild.  She had some dry heaving last night which she frequently has with her migraines.  This morning she woke up and noticed redness of her left eye but she denies any pain or visual changes.  No trauma.  No extremity weakness.  The history is provided by the patient.  Abdominal Pain Headache Associated symptoms: abdominal pain     Past Medical History:  Diagnosis Date  . Hypertension   . Migraine     Patient Active Problem List   Diagnosis Date Noted  . Right elbow pain 12/17/2010    Past Surgical History:  Procedure Laterality Date  . ABDOMINAL HYSTERECTOMY    . TONSILLECTOMY       OB History   No obstetric history on file.      Home Medications    Prior to Admission medications   Medication Sig Start Date End  Date Taking? Authorizing Provider  nortriptyline (PAMELOR) 25 MG capsule Take 25 mg by mouth at bedtime.   Yes [provider]  Acetaminophen (TYLENOL PO) Take by mouth.    [provider]  benzonatate (TESSALON) 100 MG capsule Take 1 capsule (100 mg total) by mouth every 8 (eight) hours. 01/27/18   Jola Schmidt, MD  hydrochlorothiazide (HYDRODIURIL) 25 MG tablet TK 1 T PO D 10/22/17   [provider]  lisinopril (PRINIVIL,ZESTRIL) 5 MG tablet  01/17/18   [provider]  ondansetron (ZOFRAN ODT) 4 MG disintegrating tablet Take 1 tablet (4 mg total) by mouth every 8 (eight) hours as needed for nausea or vomiting. 10/12/14   Ward, Delice Bison, DO  traMADol (ULTRAM) 50 MG tablet Take 1 tablet (50 mg total) by mouth every 6 (six) hours as needed for pain. 12/09/12   Palumbo, April, MD  traZODone (DESYREL) 100 MG tablet 1/2 tab to 1 tab nightly prn 06/24/17   [provider]    Family History Family History  Problem Relation Age of Onset  . Diabetes Mother   . Diabetes Sister   . Hypertension Brother   . Diabetes Maternal Uncle   . Hypertension Maternal Uncle   . Sudden death Paternal Aunt   . Diabetes Maternal Grandfather   . Heart attack Paternal Grandfather   . Hyperlipidemia Neg Hx  Social History Social History   Tobacco Use  . Smoking status: Never Smoker  . Smokeless tobacco: Never Used  Substance Use Topics  . Alcohol use: No  . Drug use: No     Allergies   Morphine and related   Review of Systems Review of Systems  Gastrointestinal: Positive for abdominal pain.  Neurological: Positive for headaches.   All other systems reviewed and are negative except that which was mentioned in HPI   Physical Exam Updated Vital Signs BP 138/81 (BP Location: Right Arm)   Pulse 84   Temp 98.4 F (36.9 C) (Oral)   Resp 16   Ht 5\' 1"  (1.549 m)   Wt 78 kg   SpO2 100%   BMI 32.50 kg/m   Physical Exam Vitals signs and nursing note  reviewed.  Constitutional:      General: She is not in acute distress.    Appearance: She is well-developed.  HENT:     Head: Normocephalic and atraumatic.  Eyes:     Conjunctiva/sclera:     Right eye: No hemorrhage.    Left eye: Hemorrhage present.     Pupils: Pupils are equal, round, and reactive to light.  Neck:     Musculoskeletal: Neck supple.  Cardiovascular:     Rate and Rhythm: Normal rate and regular rhythm.     Heart sounds: Normal heart sounds. No murmur.  Pulmonary:     Effort: Pulmonary effort is normal.     Breath sounds: Normal breath sounds.  Abdominal:     General: Bowel sounds are normal. There is no distension.     Palpations: Abdomen is soft.     Tenderness: There is abdominal tenderness.     Comments: Tenderness in R inguinal area near hip, no rebound or guarding  Skin:    General: Skin is warm and dry.  Neurological:     Mental Status: She is alert and oriented to person, place, and time.     Motor: No weakness.     Comments: Fluent speech  Psychiatric:        Judgment: Judgment normal.      ED Treatments / Results  Labs (all labs ordered are listed, but only abnormal results are displayed) Labs Reviewed - No data to display  EKG None  Radiology No results found.  Procedures Procedures (including critical care time)  Medications Ordered in ED Medications - No data to display   Initial Impression / Assessment and Plan / ED Course  I have reviewed the triage vital signs and the nursing notes.  Pertinent labs & imaging results that were available during my care of the patient were reviewed by me and considered in my medical decision making (see chart for details).       1. RLQ pain: chart review shows several previous evaluations for the same including multiple CT scans of abdomen negative for appendicitis or other acute process. Her description of pain worse after repetitive pushing of foot pedal at work suggests hip problem or other  musculoskeletal etiology. Given recurrent nature of sx and previous work ups for the same, I do not feel she needs labs or imaging. Have referred to outpatient sports med.  2. L eye: subconjunctival hemorrhage likely 2/2 retching last night from migraine. Discussed expected course of spontaneous resolution and return precautions regarding pain or vision changes.  3. Migraine: already improved without intervention, declined any medications here. No red flag symptoms, HA has been similar to previous migraines.  I reviewed return precautions regarding all of her symptoms and she voiced understanding.   Final Clinical Impressions(s) / ED Diagnoses   Final diagnoses:  Migraine without status migrainosus, not intractable, unspecified migraine type  Non-traumatic subconjunctival hemorrhage of left eye  Abdominal wall pain in right lower quadrant    ED Discharge Orders    None       Meleana Commerford, Ambrose Finlandachel Morgan, MD 01/19/19 1251

## 2019-01-21 ENCOUNTER — Other Ambulatory Visit: Payer: Self-pay

## 2019-01-21 ENCOUNTER — Ambulatory Visit (INDEPENDENT_AMBULATORY_CARE_PROVIDER_SITE_OTHER): Payer: BC Managed Care – PPO | Admitting: Family Medicine

## 2019-01-21 ENCOUNTER — Encounter: Payer: Self-pay | Admitting: Family Medicine

## 2019-01-21 VITALS — BP 135/91 | Ht 60.0 in | Wt 172.0 lb

## 2019-01-21 DIAGNOSIS — R200 Anesthesia of skin: Secondary | ICD-10-CM | POA: Diagnosis not present

## 2019-01-21 DIAGNOSIS — M5416 Radiculopathy, lumbar region: Secondary | ICD-10-CM | POA: Insufficient documentation

## 2019-01-21 MED ORDER — GABAPENTIN 100 MG PO CAPS: 100.0000 mg | ORAL_CAPSULE | Freq: Three times a day (TID) | ORAL | 1 refills | Status: DC

## 2019-01-21 NOTE — Patient Instructions (Signed)
Nice to meet you Please start with one pill of the gabapentin at night. You can increase this to 2 or 3 times daily as you tolerate. It may make you sleepy  You will get a call to schedule the nerve study.   Please send me a message in MyChart with any questions or updates.  We will set up an appointment after the nerve study.   --Dr. Raeford Razor

## 2019-01-21 NOTE — Assessment & Plan Note (Signed)
Altered sensation seems to be the main complaint.  Does have some pain occurring in the groin but this is intermittent.  Has some improvement with ibuprofen.  CT scan does not demonstrate any significant degenerative changes of the hips over the back.  Could be radicular in nature.  Could be association with lateral femoral cutaneous but would go down the lower leg. -Gabapentin. -Counseled on home exercise therapy and supportive care. -EMG. -If no improvement may need to consider hip injection.

## 2019-01-21 NOTE — Progress Notes (Signed)
Nina ShellLynette Mosquera - 48 y.o. female MRN [id number]  Date of birth: 1971/03/29  SUBJECTIVE:  Including CC & ROS.  Chief Complaint  Patient presents with  . Hip Pain    right hip    Nina Patel is a 48 y.o. female that is presenting with right leg altered sensation and pain. The altered sensation is occurring down the anterior thigh and lateral aspect. Does have some altered sensation down the lateral lower leg and foot. Has been occurring intermittently for one year. Symptoms are worse with prolonged sitting.  Ibuprofen seems to help her symptoms.  Has had a hysterectomy but no surgeries on the hip or back.  Symptoms are improved when she lies on the left side or on her back.  Denies any saddle anesthesia or urinary incontinence.  Independent review of the CT ab/pelvis from 2018 shows no significant degenerative changes of the hips or of the lumbar spine.    Review of Systems  Constitutional: Negative for fever.  HENT: Negative for congestion.   Respiratory: Negative for cough.   Cardiovascular: Negative for chest pain.  Gastrointestinal: Negative for abdominal pain.  Musculoskeletal: Negative for gait problem.  Skin: Negative for color change.  Neurological: Positive for numbness. Negative for weakness.  Hematological: Negative for adenopathy.    HISTORY: Past Medical, Surgical, Social, and Family History Reviewed & Updated per EMR.   Pertinent Historical Findings include:  Past Medical History:  Diagnosis Date  . Hypertension   . Migraine     Past Surgical History:  Procedure Laterality Date  . ABDOMINAL HYSTERECTOMY    . TONSILLECTOMY      Allergies  Allergen Reactions  . Morphine And Related     Hives and itching    Family History  Problem Relation Age of Onset  . Diabetes Mother   . Diabetes Sister   . Hypertension Brother   . Diabetes Maternal Uncle   . Hypertension Maternal Uncle   . Sudden death Paternal Aunt   . Diabetes Maternal Grandfather   . Heart attack  Paternal Grandfather   . Hyperlipidemia Neg Hx      Social History   Socioeconomic History  . Marital status: Married    Spouse name: Not on file  . Number of children: Not on file  . Years of education: Not on file  . Highest education level: Not on file  Occupational History  . Not on file  Social Needs  . Financial resource strain: Not on file  . Food insecurity    Worry: Not on file    Inability: Not on file  . Transportation needs    Medical: Not on file    Non-medical: Not on file  Tobacco Use  . Smoking status: Never Smoker  . Smokeless tobacco: Never Used  Substance and Sexual Activity  . Alcohol use: No  . Drug use: No  . Sexual activity: Yes    Birth control/protection: Surgical  Lifestyle  . Physical activity    Days per week: Not on file    Minutes per session: Not on file  . Stress: Not on file  Relationships  . Social Musicianconnections    Talks on phone: Not on file    Gets together: Not on file    Attends religious service: Not on file    Active member of club or organization: Not on file    Attends meetings of clubs or organizations: Not on file    Relationship status: Not on file  . Intimate  partner violence    Fear of current or ex partner: Not on file    Emotionally abused: Not on file    Physically abused: Not on file    Forced sexual activity: Not on file  Other Topics Concern  . Not on file  Social History Narrative  . Not on file     PHYSICAL EXAM:  VS: BP (!) 135/91   Ht 5' (1.524 m)   Wt 172 lb (78 kg)   BMI 33.59 kg/m  Physical Exam Gen: NAD, alert, cooperative with exam, well-appearing ENT: normal lips, normal nasal mucosa,  Eye: normal EOM, normal conjunctiva and lids CV:  no edema, +2 pedal pulses   Resp: no accessory muscle use, non-labored,  Skin: no rashes, no areas of induration  Neuro: normal tone, normal sensation to touch Psych:  normal insight, alert and oriented MSK:  Back: No tenderness to palpation of the  midline lumbar spine, SI joints. Some tenderness palpation of the greater trochanter. Weakness with hip abduction Normal strength resistance with hip flexion, knee flexion extension, plantarflexion and dorsiflexion Negative straight leg raise. Positive FADIR  Normal FABER  NVI      ASSESSMENT & PLAN:   Right leg numbness Altered sensation seems to be the main complaint.  Does have some pain occurring in the groin but this is intermittent.  Has some improvement with ibuprofen.  CT scan does not demonstrate any significant degenerative changes of the hips over the back.  Could be radicular in nature.  Could be association with lateral femoral cutaneous but would go down the lower leg. -Gabapentin. -Counseled on home exercise therapy and supportive care. -EMG. -If no improvement may need to consider hip injection.

## 2019-01-24 ENCOUNTER — Ambulatory Visit: Payer: BLUE CROSS/BLUE SHIELD | Admitting: Family Medicine

## 2019-01-24 ENCOUNTER — Other Ambulatory Visit: Payer: Self-pay | Admitting: *Deleted

## 2019-01-24 DIAGNOSIS — R200 Anesthesia of skin: Secondary | ICD-10-CM

## 2019-01-24 MED ORDER — GABAPENTIN 100 MG PO CAPS: 100.0000 mg | ORAL_CAPSULE | Freq: Three times a day (TID) | ORAL | 1 refills | Status: DC

## 2019-01-26 ENCOUNTER — Ambulatory Visit: Payer: BC Managed Care – PPO | Admitting: Family Medicine

## 2019-01-26 NOTE — Addendum Note (Signed)
Addended by: Sherrie George F on: 01/26/2019 09:50 AM   Modules accepted: Orders

## 2019-01-26 NOTE — Addendum Note (Signed)
Addended by: Sherrie George F on: 01/26/2019 09:52 AM   Modules accepted: Orders

## 2019-01-27 ENCOUNTER — Ambulatory Visit (HOSPITAL_BASED_OUTPATIENT_CLINIC_OR_DEPARTMENT_OTHER)
Admission: RE | Admit: 2019-01-27 | Discharge: 2019-01-27 | Disposition: A | Discharge status: Home / Self Care | Payer: BC Managed Care – PPO | Source: Ambulatory Visit | Attending: Family Medicine | Admitting: Family Medicine

## 2019-01-27 ENCOUNTER — Other Ambulatory Visit: Payer: Self-pay

## 2019-01-27 ENCOUNTER — Encounter: Payer: Self-pay | Admitting: Family Medicine

## 2019-01-27 ENCOUNTER — Ambulatory Visit (INDEPENDENT_AMBULATORY_CARE_PROVIDER_SITE_OTHER): Payer: BC Managed Care – PPO | Admitting: Family Medicine

## 2019-01-27 VITALS — BP 138/84 | Ht 60.0 in | Wt 172.0 lb

## 2019-01-27 DIAGNOSIS — M5416 Radiculopathy, lumbar region: Secondary | ICD-10-CM

## 2019-01-27 DIAGNOSIS — R200 Anesthesia of skin: Secondary | ICD-10-CM

## 2019-01-27 MED ORDER — PREDNISONE 5 MG PO TABS: ORAL_TABLET | ORAL | 0 refills | Status: DC

## 2019-01-27 NOTE — Assessment & Plan Note (Signed)
Acute exacerbation of the pain. No saddle anesthesia or urinary incontinence.  Symptom suggestive of nerve impingement.  If may need to consider hip joint injection as well that may be a complicating factor. -Prednisone. -X-ray. -We will discontinue EMG as it is not scheduled till end of October. -MRI to evaluate for nerve impingement.

## 2019-01-27 NOTE — Patient Instructions (Signed)
Good to see you Please try the medicine   Please send me a message in MyChart with any questions or updates.  We will perform a virtual visit once the nerve study is completed. .   --Dr. Raeford Razor

## 2019-01-27 NOTE — Progress Notes (Signed)
Nina Patel - 48 y.o. female MRN [id number]  Date of birth: [birth year]  SUBJECTIVE:  Including CC & ROS.  Chief Complaint  Patient presents with  . Leg Pain    right leg    Nina Patel is a 48 y.o. female that is presenting with acute exacerbation of the right leg pain and numbness.  She does have some pain in the inguinal region but most of the pain radiates down the anterior lateral aspect of the leg and into the foot.  It is worse with prolonged walking.  Has had some mild improvement with the gabapentin.  Has not been contacted about the nerve study.  Denies any saddle anesthesia or urinary incontinence.  Symptoms are intermittent in nature.  Can be moderate to severe..   Review of Systems  Constitutional: Negative for fever.  HENT: Negative for congestion.   Respiratory: Negative for cough.   Cardiovascular: Negative for chest pain.  Gastrointestinal: Negative for abdominal pain.  Musculoskeletal: Positive for back pain.  Neurological: Negative for weakness.  Hematological: Negative for adenopathy.    HISTORY: Past Medical, Surgical, Social, and Family History Reviewed & Updated per EMR.   Pertinent Historical Findings include:  Past Medical History:  Diagnosis Date  . Hypertension   . Migraine     Past Surgical History:  Procedure Laterality Date  . ABDOMINAL HYSTERECTOMY    . TONSILLECTOMY      Allergies  Allergen Reactions  . Morphine And Related     Hives and itching    Family History  Problem Relation Age of Onset  . Diabetes Mother   . Diabetes Sister   . Hypertension Brother   . Diabetes Maternal Uncle   . Hypertension Maternal Uncle   . Sudden death Paternal Aunt   . Diabetes Maternal Grandfather   . Heart attack Paternal Grandfather   . Hyperlipidemia Neg Hx      Social History   Socioeconomic History  . Marital status: Married    Spouse name: Not on file  . Number of children: Not on file  . Years of education: Not on file  . Highest  education level: Not on file  Occupational History  . Not on file  Social Needs  . Financial resource strain: Not on file  . Food insecurity    Worry: Not on file    Inability: Not on file  . Transportation needs    Medical: Not on file    Non-medical: Not on file  Tobacco Use  . Smoking status: Never Smoker  . Smokeless tobacco: Never Used  Substance and Sexual Activity  . Alcohol use: No  . Drug use: No  . Sexual activity: Yes    Birth control/protection: Surgical  Lifestyle  . Physical activity    Days per week: Not on file    Minutes per session: Not on file  . Stress: Not on file  Relationships  . Social Herbalist on phone: Not on file    Gets together: Not on file    Attends religious service: Not on file    Active member of club or organization: Not on file    Attends meetings of clubs or organizations: Not on file    Relationship status: Not on file  . Intimate partner violence    Fear of current or ex partner: Not on file    Emotionally abused: Not on file    Physically abused: Not on file    Forced sexual activity:  Not on file  Other Topics Concern  . Not on file  Social History Narrative  . Not on file     PHYSICAL EXAM:  VS: BP 138/84   Ht 5' (1.524 m)   Wt 172 lb (78 kg)   BMI 33.59 kg/m  Physical Exam Gen: NAD, alert, cooperative with exam, well-appearing ENT: normal lips, normal nasal mucosa,  Eye: normal EOM, normal conjunctiva and lids CV:  no edema, +2 pedal pulses   Resp: no accessory muscle use, non-labored,  Skin: no rashes, no areas of induration  Neuro: normal tone, normal sensation to touch Psych:  normal insight, alert and oriented MSK:  Back/right hip No tenderness to palpation over the greater trochanter. Pain with IR  Normal strength to resistance with hip flexion, knee flexion and extension  Normal gait Negative SLR  NVI.      ASSESSMENT & PLAN:   Lumbar radiculopathy Acute exacerbation of the pain. No  saddle anesthesia or urinary incontinence.  Symptom suggestive of nerve impingement.  If may need to consider hip joint injection as well that may be a complicating factor. -Prednisone. -X-ray. -We will discontinue EMG as it is not scheduled till end of October. -MRI to evaluate for nerve impingement.

## 2019-01-28 ENCOUNTER — Encounter: Payer: Self-pay | Admitting: Family Medicine

## 2019-01-31 ENCOUNTER — Telehealth: Payer: Self-pay | Admitting: Family Medicine

## 2019-01-31 ENCOUNTER — Encounter: Payer: Self-pay | Admitting: Family Medicine

## 2019-01-31 NOTE — Telephone Encounter (Signed)
Spoke with patient about results.   Rosemarie Ax, MD [facility] 01/31/2019, 8:05 AM

## 2019-02-02 NOTE — Addendum Note (Signed)
Addended by: Sherrie George F on: 02/02/2019 09:54 AM   Modules accepted: Orders

## 2019-02-07 ENCOUNTER — Telehealth: Payer: Self-pay | Admitting: Family Medicine

## 2019-02-07 NOTE — Telephone Encounter (Signed)
Left VM for patient. If she calls back please have her speak with a nurse/CMA and inform we could wait until the end of October to try to get a nerve conduction study or we could try to proceed with an MRI. The MRI would be of the lower back to see if that is the source of her pain/numbness.   If any questions then please take the best time and phone number to call and I will try to call her back.   Rosemarie Ax, MD [facility] 02/07/2019, 10:12 AM

## 2019-03-17 ENCOUNTER — Telehealth: Payer: Self-pay | Admitting: Family Medicine

## 2019-03-17 DIAGNOSIS — M5416 Radiculopathy, lumbar region: Secondary | ICD-10-CM

## 2019-03-17 DIAGNOSIS — R200 Anesthesia of skin: Secondary | ICD-10-CM

## 2019-03-17 NOTE — Telephone Encounter (Signed)
Patient is requesting a new referral to [facility] for NCV with EMG for right lower extremity.   She was unable to schedule initially due to outstanding balance, but is able to be scheduled now.

## 2019-03-18 NOTE — Addendum Note (Signed)
Addended by: Sherrie George F on: 03/18/2019 11:18 AM   Modules accepted: Orders

## 2019-03-18 NOTE — Telephone Encounter (Signed)
Placed EMG to [facility].   Rosemarie Ax, MD [facility] 03/18/2019, 10:57 AM

## 2019-05-03 ENCOUNTER — Encounter: Payer: No Typology Code available for payment source | Admitting: Neurology

## 2019-05-31 ENCOUNTER — Encounter: Payer: No Typology Code available for payment source | Admitting: Neurology

## 2019-06-06 ENCOUNTER — Encounter: Payer: Self-pay | Admitting: Neurology

## 2019-08-11 ENCOUNTER — Encounter (HOSPITAL_BASED_OUTPATIENT_CLINIC_OR_DEPARTMENT_OTHER): Payer: Self-pay

## 2019-08-11 ENCOUNTER — Other Ambulatory Visit: Payer: Self-pay

## 2019-08-11 ENCOUNTER — Emergency Department (HOSPITAL_BASED_OUTPATIENT_CLINIC_OR_DEPARTMENT_OTHER)
Admission: EM | Admit: 2019-08-11 | Discharge: 2019-08-12 | Disposition: A | Discharge status: Home / Self Care | Payer: BC Managed Care – PPO | Attending: Emergency Medicine | Admitting: Emergency Medicine

## 2019-08-11 DIAGNOSIS — R05 Cough: Secondary | ICD-10-CM | POA: Insufficient documentation

## 2019-08-11 DIAGNOSIS — I10 Essential (primary) hypertension: Secondary | ICD-10-CM | POA: Insufficient documentation

## 2019-08-11 DIAGNOSIS — Z20822 Contact with and (suspected) exposure to covid-19: Secondary | ICD-10-CM | POA: Diagnosis not present

## 2019-08-11 DIAGNOSIS — Z79899 Other long term (current) drug therapy: Secondary | ICD-10-CM | POA: Diagnosis not present

## 2019-08-11 DIAGNOSIS — R509 Fever, unspecified: Secondary | ICD-10-CM | POA: Diagnosis present

## 2019-08-11 DIAGNOSIS — B349 Viral infection, unspecified: Secondary | ICD-10-CM | POA: Diagnosis not present

## 2019-08-11 DIAGNOSIS — R519 Headache, unspecified: Secondary | ICD-10-CM | POA: Diagnosis not present

## 2019-08-11 NOTE — ED Triage Notes (Signed)
Pt c/o fever, HA, sore throat x 2 days-last dose tylenol 730p-NAD-steady gait

## 2019-08-12 LAB — GROUP A STREP BY PCR: Group A Strep by PCR: NOT DETECTED

## 2019-08-12 LAB — SARS CORONAVIRUS 2 (TAT 6-24 HRS): SARS Coronavirus 2: NEGATIVE

## 2019-08-12 MED ORDER — DEXAMETHASONE 6 MG PO TABS
10.0000 mg | ORAL_TABLET | Freq: Once | ORAL | Status: AC
  Administered 2019-08-12: 10 mg via ORAL
  Filled 2019-08-12: qty 1

## 2019-08-12 MED ORDER — KETOROLAC TROMETHAMINE 60 MG/2ML IM SOLN
30.0000 mg | Freq: Once | INTRAMUSCULAR | Status: AC
  Administered 2019-08-12: 30 mg via INTRAMUSCULAR
  Filled 2019-08-12: qty 2

## 2019-08-12 MED ORDER — ONDANSETRON 4 MG PO TBDP
8.0000 mg | ORAL_TABLET | Freq: Once | ORAL | Status: AC
  Administered 2019-08-12: 8 mg via ORAL
  Filled 2019-08-12: qty 2

## 2019-08-12 NOTE — ED Notes (Signed)
PO challenge given  

## 2019-08-12 NOTE — ED Provider Notes (Signed)
[facility] EMERGENCY DEPARTMENT Provider Note   CSN: [id number] Arrival date & time: 08/11/19  2210     History Chief Complaint  Patient presents with  . Fever    Ester Hilley is a 49 y.o. female.  The history is provided by the patient.  Fever Max temp prior to arrival:  100 Temp source:  Oral Severity:  Mild Onset quality:  Gradual Duration:  2 days Timing:  Intermittent Chronicity:  New Associated symptoms: chills, congestion, cough, headaches, rhinorrhea and sore throat   Associated symptoms: no chest pain, no confusion and no myalgias        Past Medical History:  Diagnosis Date  . Hypertension   . Migraine     Patient Active Problem List   Diagnosis Date Noted  . Lumbar radiculopathy 01/21/2019  . Right elbow pain 12/17/2010    Past Surgical History:  Procedure Laterality Date  . ABDOMINAL HYSTERECTOMY    . TONSILLECTOMY       OB History   No obstetric history on file.     Family History  Problem Relation Age of Onset  . Diabetes Mother   . Diabetes Sister   . Hypertension Brother   . Diabetes Maternal Uncle   . Hypertension Maternal Uncle   . Sudden death Paternal Aunt   . Diabetes Maternal Grandfather   . Heart attack Paternal Grandfather   . Hyperlipidemia Neg Hx     Social History   Tobacco Use  . Smoking status: Never Smoker  . Smokeless tobacco: Never Used  Substance Use Topics  . Alcohol use: No  . Drug use: No    Home Medications Prior to Admission medications   Medication Sig Start Date End Date Taking? Authorizing Provider  Acetaminophen (TYLENOL PO) Take by mouth.    [provider]  benzonatate (TESSALON) 100 MG capsule Take 1 capsule (100 mg total) by mouth every 8 (eight) hours. 01/27/18   Azalia Bilis, MD  gabapentin (NEURONTIN) 100 MG capsule Take 1 capsule (100 mg total) by mouth 3 (three) times daily. 01/24/19   Myra Rude, MD  hydrochlorothiazide (HYDRODIURIL) 25 MG tablet TK 1 T PO D  10/22/17   [provider]  lisinopril (PRINIVIL,ZESTRIL) 5 MG tablet  01/17/18   [provider]  meloxicam (MOBIC) 7.5 MG tablet Take 1 tablet (7.5 mg total) by mouth daily. 01/19/19   Little, Ambrose Finland, MD  nortriptyline (PAMELOR) 25 MG capsule Take 25 mg by mouth at bedtime.    [provider]  ondansetron (ZOFRAN ODT) 4 MG disintegrating tablet Take 1 tablet (4 mg total) by mouth every 8 (eight) hours as needed for nausea or vomiting. 10/12/14   Ward, Layla Maw, DO  predniSONE (DELTASONE) 5 MG tablet Take 6 pills for first day, 5 pills second day, 4 pills third day, 3 pills fourth day, 2 pills the fifth day, and 1 pill sixth day. 01/27/19   Myra Rude, MD  predniSONE (DELTASONE) 5 MG tablet Take 6 pills for first day, 5 pills second day, 4 pills third day, 3 pills fourth day, 2 pills the fifth day, and 1 pill sixth day. 01/27/19   Myra Rude, MD  traMADol (ULTRAM) 50 MG tablet Take 1 tablet (50 mg total) by mouth every 6 (six) hours as needed for pain. 12/09/12   Palumbo, April, MD  traZODone (DESYREL) 100 MG tablet 1/2 tab to 1 tab nightly prn 06/24/17   [provider]    Allergies  Morphine and related  Review of Systems   Review of Systems  Constitutional: Positive for chills and fever.  HENT: Positive for congestion, rhinorrhea and sore throat.   Respiratory: Positive for cough.   Cardiovascular: Negative for chest pain.  Musculoskeletal: Negative for myalgias.  Neurological: Positive for headaches.  Psychiatric/Behavioral: Negative for confusion.  All other systems reviewed and are negative.   Physical Exam Updated Vital Signs BP (!) 154/89 (BP Location: Left Arm)   Pulse 67   Temp 98.3 F (36.8 C) (Oral)   Resp 16   Ht 5\' 1"  (1.549 m)   Wt 83.1 kg   SpO2 100%   BMI 34.63 kg/m   Physical Exam Vitals and nursing note reviewed.  Constitutional:      Appearance: She is well-developed.  HENT:     Head: Normocephalic and  atraumatic.     Mouth/Throat:     Mouth: Mucous membranes are dry.     Pharynx: Oropharynx is clear.  Eyes:     Conjunctiva/sclera: Conjunctivae normal.  Cardiovascular:     Rate and Rhythm: Normal rate and regular rhythm.  Pulmonary:     Effort: No respiratory distress.     Breath sounds: No stridor.  Abdominal:     General: There is no distension.  Musculoskeletal:        General: No swelling or tenderness. Normal range of motion.     Cervical back: Normal range of motion.  Skin:    General: Skin is warm and dry.  Neurological:     General: No focal deficit present.     Mental Status: She is alert.     ED Results / Procedures / Treatments   Labs (all labs ordered are listed, but only abnormal results are displayed) Labs Reviewed  GROUP A STREP BY PCR  SARS CORONAVIRUS 2 (TAT 6-24 HRS)    EKG None  Radiology No results found.  Procedures Procedures (including critical care time)  Medications Ordered in ED Medications  dexamethasone (DECADRON) tablet 10 mg (10 mg Oral Given 08/12/19 0043)  ketorolac (TORADOL) injection 30 mg (30 mg Intramuscular Given 08/12/19 0019)  ondansetron (ZOFRAN-ODT) disintegrating tablet 8 mg (8 mg Oral Given 08/12/19 0019)    ED Course  I have reviewed the triage vital signs and the nursing notes.  Pertinent labs & imaging results that were available during my care of the patient were reviewed by me and considered in my medical decision making (see chart for details).    MDM Rules/Calculators/A&P  Viral illness of unclear etiology. No resp distress. covid ordered. Strep negative. Stable for discharge.   Final Clinical Impression(s) / ED Diagnoses Final diagnoses:  Viral illness    Rx / DC Orders ED Discharge Orders    None       Gauri Galvao, Corene Cornea, MD 08/12/19 0202

## 2020-04-08 ENCOUNTER — Emergency Department (HOSPITAL_BASED_OUTPATIENT_CLINIC_OR_DEPARTMENT_OTHER)
Admission: EM | Admit: 2020-04-08 | Discharge: 2020-04-08 | Disposition: A | Discharge status: Home / Self Care | Payer: BC Managed Care – PPO | Attending: Emergency Medicine | Admitting: Emergency Medicine

## 2020-04-08 ENCOUNTER — Other Ambulatory Visit: Payer: Self-pay

## 2020-04-08 ENCOUNTER — Encounter (HOSPITAL_BASED_OUTPATIENT_CLINIC_OR_DEPARTMENT_OTHER): Payer: Self-pay | Admitting: *Deleted

## 2020-04-08 DIAGNOSIS — Z79899 Other long term (current) drug therapy: Secondary | ICD-10-CM | POA: Diagnosis not present

## 2020-04-08 DIAGNOSIS — Z20822 Contact with and (suspected) exposure to covid-19: Secondary | ICD-10-CM | POA: Diagnosis not present

## 2020-04-08 DIAGNOSIS — R519 Headache, unspecified: Secondary | ICD-10-CM | POA: Diagnosis present

## 2020-04-08 DIAGNOSIS — I10 Essential (primary) hypertension: Secondary | ICD-10-CM | POA: Diagnosis not present

## 2020-04-08 DIAGNOSIS — B349 Viral infection, unspecified: Secondary | ICD-10-CM | POA: Diagnosis not present

## 2020-04-08 LAB — RESP PANEL BY RT-PCR (FLU A&B, COVID) ARPGX2
Influenza A by PCR: NEGATIVE
Influenza B by PCR: NEGATIVE
SARS Coronavirus 2 by RT PCR: NEGATIVE

## 2020-04-08 NOTE — ED Provider Notes (Signed)
[facility] EMERGENCY DEPARTMENT Provider Note   CSN: [id number] Arrival date & time: 04/08/20  0559     History Chief Complaint  Patient presents with  . covid symptoms and exposure    Nina Patel is a 49 y.o. female w/ hx of HTN, obesity, migraines, presenting to the ED with viral-type syndrome.  Patient's husband tested positive for Covid 19 yesterday.  She reports symptom onset 5 days ago (on Tuesday) with headaches, myalgia, arthralgia, chills, fatigue, loss of taste.  She did receive the Pzifer covid vaccine x 2 in April 2021.  She denies hx of asthma or smoking. Allergies to opioids only  HPI     Past Medical History:  Diagnosis Date  . Hypertension   . Migraine     Patient Active Problem List   Diagnosis Date Noted  . Lumbar radiculopathy 01/21/2019  . Right elbow pain 12/17/2010    Past Surgical History:  Procedure Laterality Date  . ABDOMINAL HYSTERECTOMY    . TONSILLECTOMY       OB History   No obstetric history on file.     Family History  Problem Relation Age of Onset  . Diabetes Mother   . Diabetes Sister   . Hypertension Brother   . Diabetes Maternal Uncle   . Hypertension Maternal Uncle   . Sudden death Paternal Aunt   . Diabetes Maternal Grandfather   . Heart attack Paternal Grandfather   . Hyperlipidemia Neg Hx     Social History   Tobacco Use  . Smoking status: Never Smoker  . Smokeless tobacco: Never Used  Vaping Use  . Vaping Use: Never used  Substance Use Topics  . Alcohol use: No  . Drug use: No    Home Medications Prior to Admission medications   Medication Sig Start Date End Date Taking? Authorizing Provider  hydrochlorothiazide (HYDRODIURIL) 25 MG tablet TK 1 T PO D 10/22/17   [provider]  lisinopril (PRINIVIL,ZESTRIL) 5 MG tablet  01/17/18   [provider]  ondansetron (ZOFRAN ODT) 4 MG disintegrating tablet Take 1 tablet (4 mg total) by mouth every 8 (eight) hours as needed for  nausea or vomiting. 10/12/14   Ward, Layla Maw, DO    Allergies    Morphine and related  Review of Systems   Review of Systems  Constitutional: Positive for appetite change, chills, fatigue and fever.  HENT: Positive for congestion and sore throat.   Eyes: Negative for pain and visual disturbance.  Respiratory: Positive for cough and shortness of breath.   Cardiovascular: Negative for chest pain and palpitations.  Gastrointestinal: Positive for nausea. Negative for abdominal pain and vomiting.  Genitourinary: Negative for dysuria and hematuria.  Musculoskeletal: Negative for arthralgias and back pain.  Skin: Negative for color change and rash.  Neurological: Positive for light-headedness and headaches. Negative for syncope.  Psychiatric/Behavioral: Negative for agitation and confusion.  All other systems reviewed and are negative.   Physical Exam Updated Vital Signs BP 140/74 (BP Location: Right Arm)   Pulse 71   Temp 97.8 F (36.6 C) (Oral)   Resp 18   Ht 5\' 8"  (1.727 m)   Wt 76.2 kg   SpO2 98%   BMI 25.54 kg/m   Physical Exam Vitals and nursing note reviewed.  Constitutional:      General: She is not in acute distress.    Appearance: She is well-developed.  HENT:     Head: Normocephalic and atraumatic.  Eyes:  Conjunctiva/sclera: Conjunctivae normal.  Cardiovascular:     Rate and Rhythm: Normal rate and regular rhythm.     Heart sounds: No murmur heard.   Pulmonary:     Effort: Pulmonary effort is normal. No respiratory distress.     Comments: 99% on room air Musculoskeletal:     Cervical back: Neck supple.  Skin:    General: Skin is warm and dry.  Neurological:     Mental Status: She is alert.  Psychiatric:        Mood and Affect: Mood normal.        Behavior: Behavior normal.     ED Results / Procedures / Treatments   Labs (all labs ordered are listed, but only abnormal results are displayed) Labs Reviewed  RESP PANEL BY RT-PCR (FLU A&B, COVID)  ARPGX2    EKG None  Radiology No results found.  Procedures Procedures (including critical care time)  Medications Ordered in ED Medications - No data to display  ED Course  I have reviewed the triage vital signs and the nursing notes.  Pertinent labs & imaging results that were available during my care of the patient were reviewed by me and considered in my medical decision making (see chart for details).  49 yo female here with viral type symptoms for 5 days Husband is positive for Covid this week She is vaccinated, however her history and exam are highly consistent with Covid-19 infection  Here her PCR was negative.  However I explained to her my concern for a false negative and strongly advised she get retested in 1-2 days as an outpatient.  Work note provided.  Doubt bacterial PNA at this time No hypoxia Doubt meningitis. Okay for discharge  Nina Patel was evaluated in Emergency Department on 04/09/2020 for the symptoms described in the history of present illness. She was evaluated in the context of the global COVID-19 pandemic, which necessitated consideration that the patient might be at risk for infection with the SARS-CoV-2 virus that causes COVID-19. Institutional protocols and algorithms that pertain to the evaluation of patients at risk for COVID-19 are in a state of rapid change based on information released by regulatory bodies including the CDC and federal and state organizations. These policies and algorithms were followed during the patient's care in the ED.     Final Clinical Impression(s) / ED Diagnoses Final diagnoses:  Viral illness    Rx / DC Orders ED Discharge Orders    None       Terald Sleeper, MD 04/09/20 1050

## 2020-04-08 NOTE — ED Notes (Signed)
Pt discharged to home. Discharge instructions have been discussed with patient and/or family members. Pt verbally acknowledges understanding d/c instructions, and endorses comprehension to checkout at registration before leaving.  °

## 2020-04-08 NOTE — Discharge Instructions (Signed)
Your exam and history are consistent with a viral illness.  Your covid and flu test was negative today.  However, as I explained, I still have a strong suspicion you have covid.  This may be a false negative test.  I would recommend that you get yourself retested in 1-2 days.  You can buy a home testing kit at any pharmacy or find a drive-through testing site: you do NOT need to come back to the ER to get this done.  A work note was provided.  I would recommend quarantining at home until you are symptom free for 3 days.

## 2020-04-08 NOTE — ED Triage Notes (Addendum)
Pt c/o covid symptoms that started on  Tuesday. Husband took a home covid test last night that was positive. C/o aching, does not have taste, slight cough, and denies sob. Has taken delsym for her cough. Took motrin at 4am. Pt has been vaccinated.

## 2020-07-01 IMAGING — DX DG LUMBAR SPINE 2-3V
3 series · 3 of 3 positions shown · non-contrast
Comparison: CT 09/22/2016

CLINICAL DATA: Low back pain with right lower extremity numbness

EXAM:
LUMBAR SPINE - 2-3 VIEW

[l-spine ap]
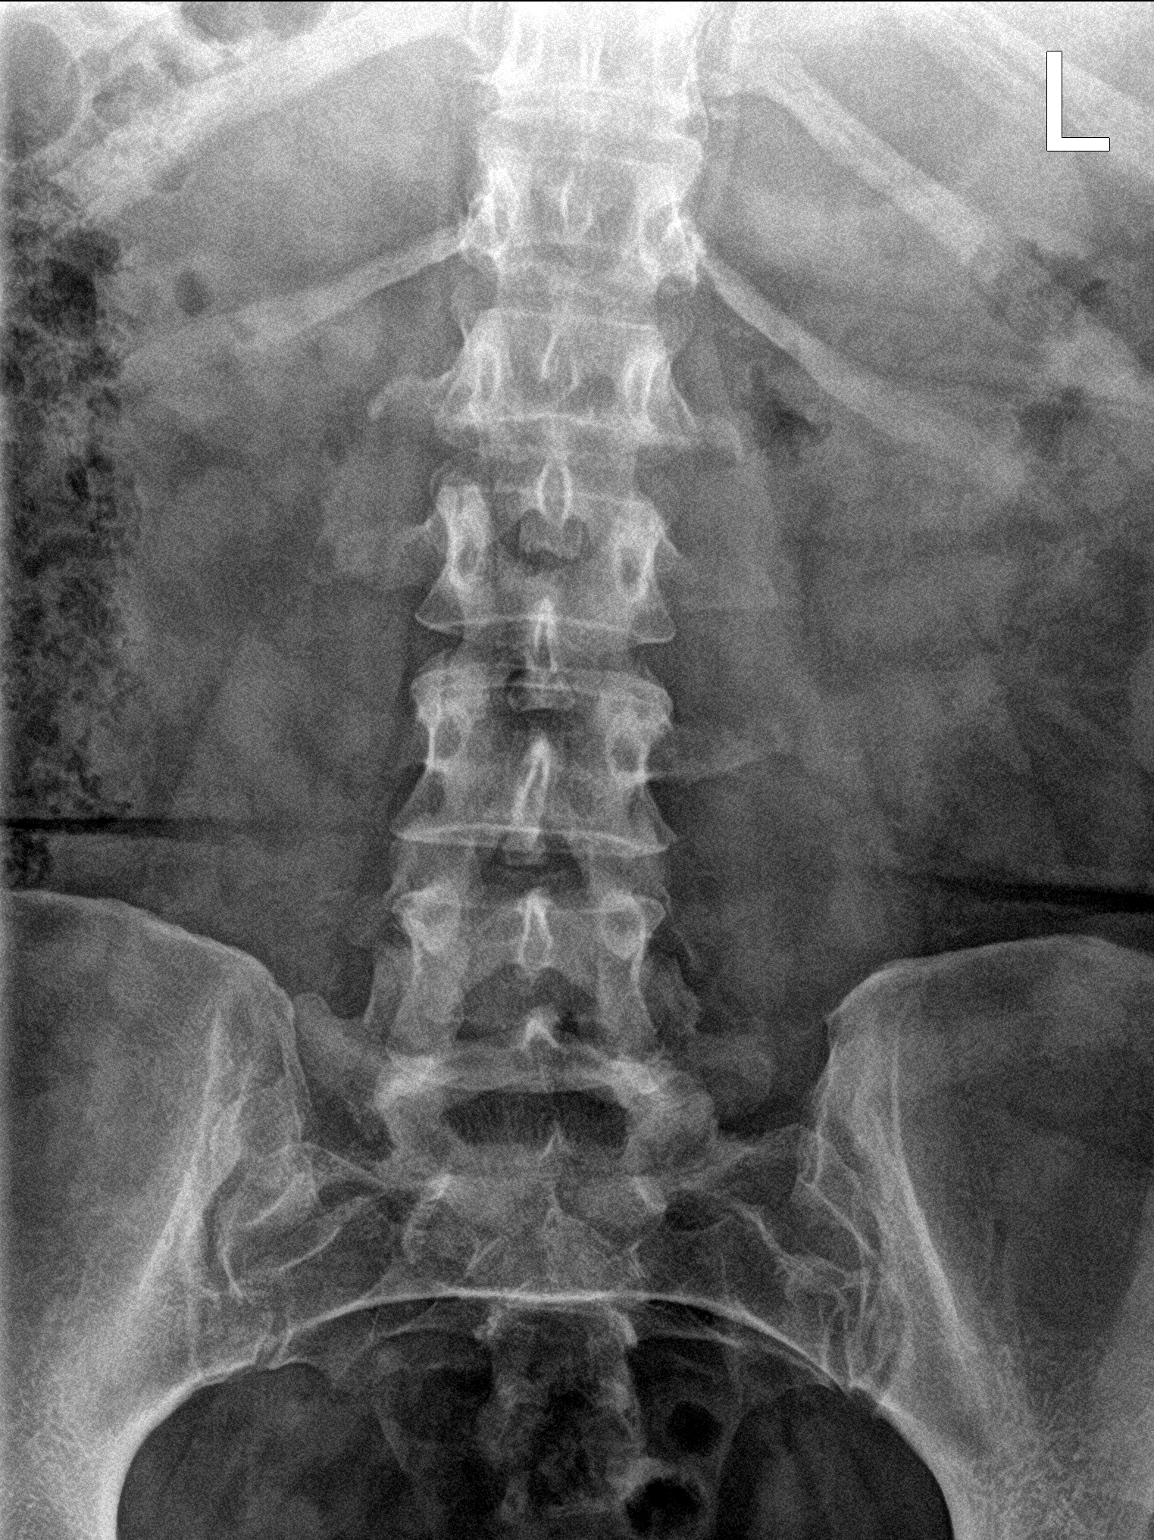

[l-spine lat]
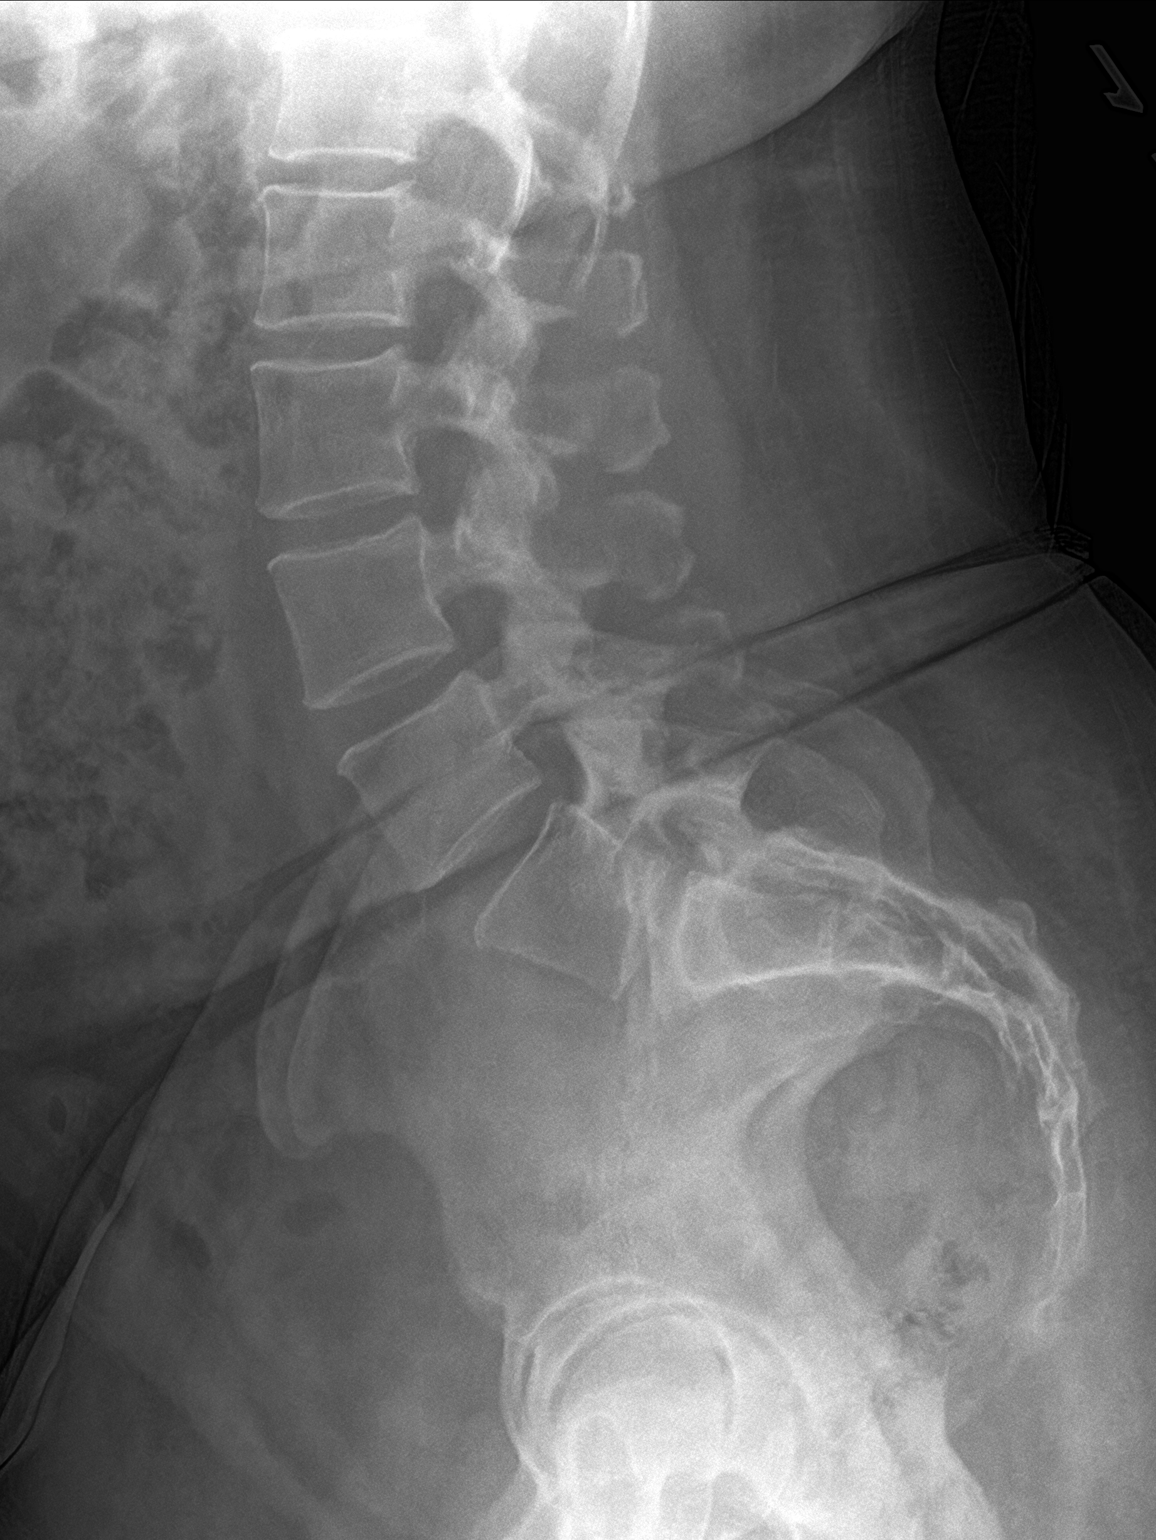

[l-spine spot]
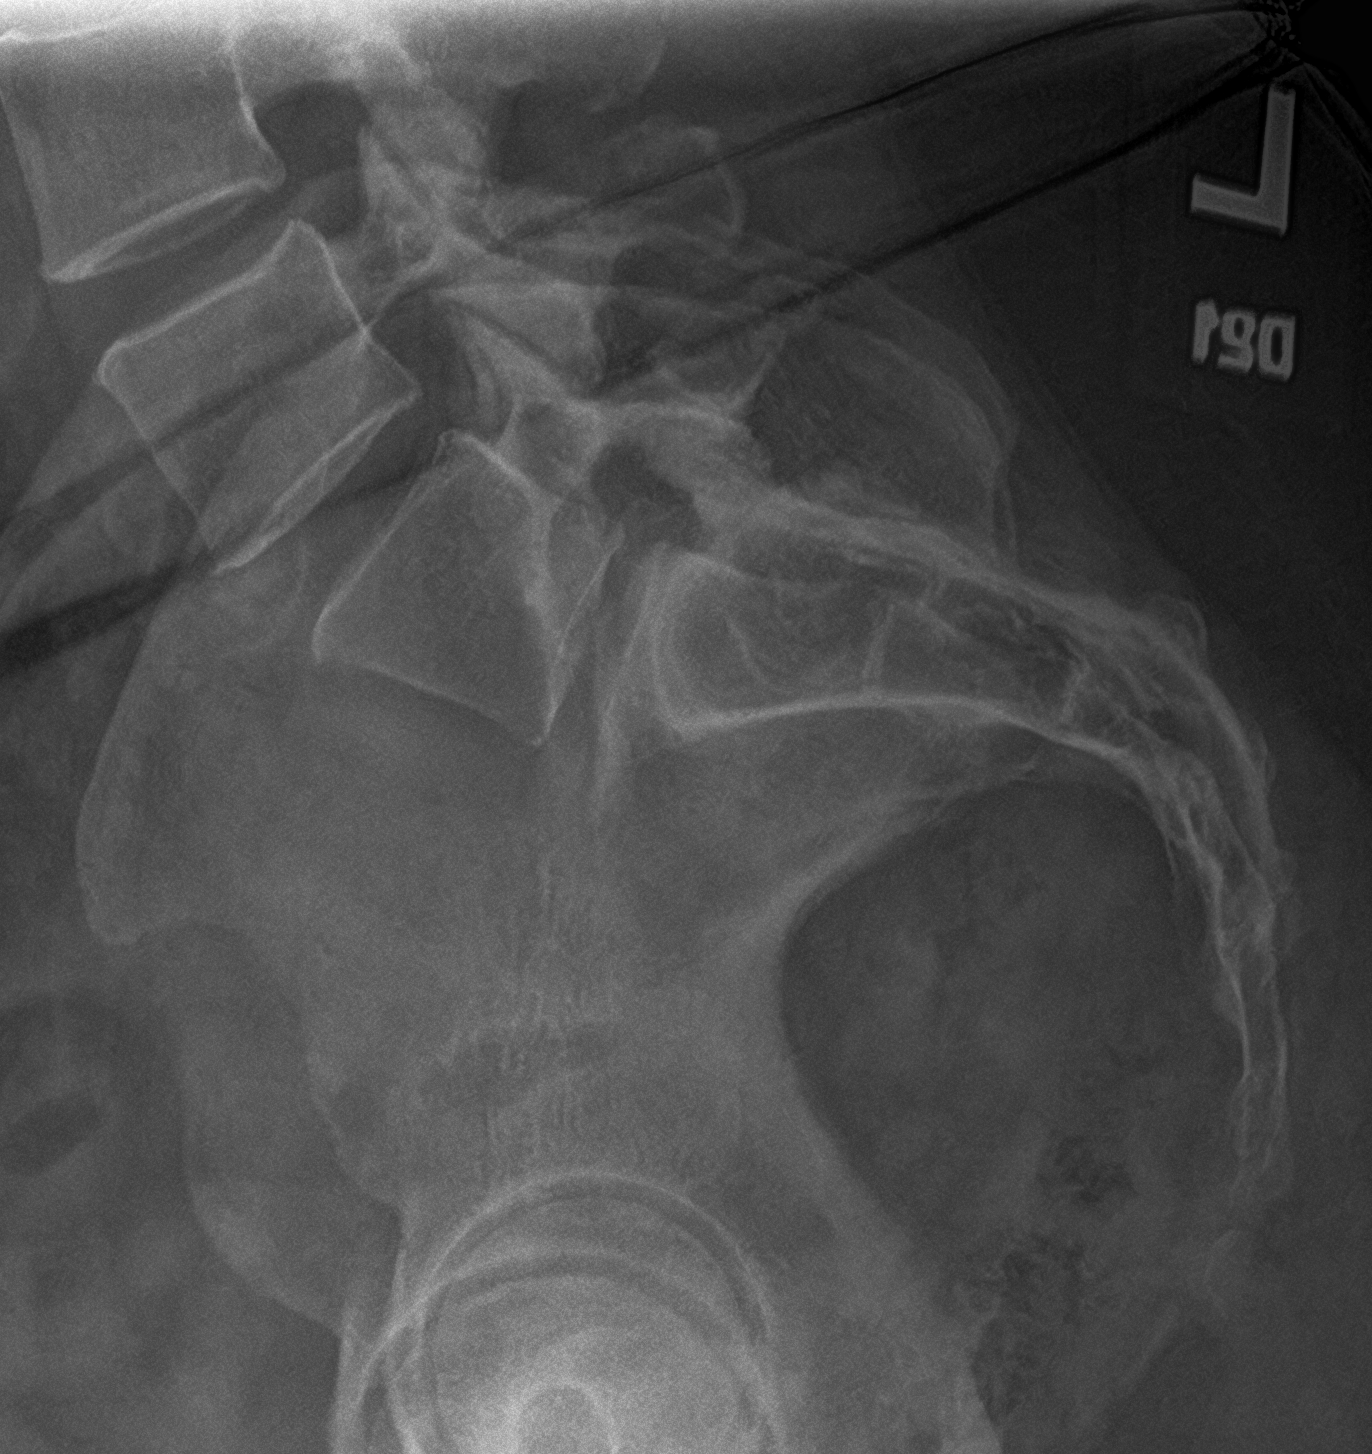

[3 of 3 positions shown; findings below may reference images not displayed]

FINDINGS: Transitional lumbosacral anatomy with sacralization of the L5
segment. Vertebral body heights and alignment are maintained.
Intervertebral disc spaces preserved. Facet joints are well aligned
without significant arthropathy. Visualized SI joints appear
unremarkable.
IMPRESSION: Negative radiographs of the lumbar spine.

## 2020-07-18 ENCOUNTER — Emergency Department (HOSPITAL_BASED_OUTPATIENT_CLINIC_OR_DEPARTMENT_OTHER)
Admission: EM | Admit: 2020-07-18 | Discharge: 2020-07-18 | Disposition: A | Discharge status: Home / Self Care | Payer: BC Managed Care – PPO | Attending: Emergency Medicine | Admitting: Emergency Medicine

## 2020-07-18 ENCOUNTER — Emergency Department (HOSPITAL_BASED_OUTPATIENT_CLINIC_OR_DEPARTMENT_OTHER): Payer: BC Managed Care – PPO

## 2020-07-18 ENCOUNTER — Other Ambulatory Visit: Payer: Self-pay

## 2020-07-18 ENCOUNTER — Encounter (HOSPITAL_BASED_OUTPATIENT_CLINIC_OR_DEPARTMENT_OTHER): Payer: Self-pay

## 2020-07-18 DIAGNOSIS — R1011 Right upper quadrant pain: Secondary | ICD-10-CM

## 2020-07-18 DIAGNOSIS — I10 Essential (primary) hypertension: Secondary | ICD-10-CM | POA: Insufficient documentation

## 2020-07-18 DIAGNOSIS — Z79899 Other long term (current) drug therapy: Secondary | ICD-10-CM | POA: Insufficient documentation

## 2020-07-18 DIAGNOSIS — M79621 Pain in right upper arm: Secondary | ICD-10-CM

## 2020-07-18 DIAGNOSIS — Z20822 Contact with and (suspected) exposure to covid-19: Secondary | ICD-10-CM | POA: Insufficient documentation

## 2020-07-18 DIAGNOSIS — R202 Paresthesia of skin: Secondary | ICD-10-CM | POA: Diagnosis not present

## 2020-07-18 LAB — URINALYSIS, ROUTINE W REFLEX MICROSCOPIC
Bilirubin Urine: NEGATIVE
Glucose, UA: NEGATIVE mg/dL
Hgb urine dipstick: NEGATIVE
Ketones, ur: NEGATIVE mg/dL
Leukocytes,Ua: NEGATIVE
Nitrite: NEGATIVE
Protein, ur: NEGATIVE mg/dL
Specific Gravity, Urine: 1.02 (ref 1.005–1.030)
pH: 7 (ref 5.0–8.0)

## 2020-07-18 LAB — COMPREHENSIVE METABOLIC PANEL
ALT: 29 U/L (ref 0–44)
AST: 16 U/L (ref 15–41)
Albumin: 4.1 g/dL (ref 3.5–5.0)
Alkaline Phosphatase: 96 U/L (ref 38–126)
Anion gap: 10 (ref 5–15)
BUN: 15 mg/dL (ref 6–20)
CO2: 27 mmol/L (ref 22–32)
Calcium: 9.2 mg/dL (ref 8.9–10.3)
Chloride: 102 mmol/L (ref 98–111)
Creatinine, Ser: 0.75 mg/dL (ref 0.44–1.00)
GFR, Estimated: >60 mL/min (ref >60)
Glucose, Bld: 94 mg/dL (ref 70–99)
Potassium: 3.6 mmol/L (ref 3.5–5.1)
Sodium: 139 mmol/L (ref 135–145)
Total Bilirubin: 0.3 mg/dL (ref 0.3–1.2)
Total Protein: 7.9 g/dL (ref 6.5–8.1)

## 2020-07-18 LAB — CBC
HCT: 40.4 % (ref 36.0–46.0)
Hemoglobin: 12.8 g/dL (ref 12.0–15.0)
MCH: 28.2 pg (ref 26.0–34.0)
MCHC: 31.7 g/dL (ref 30.0–36.0)
MCV: 89 fL (ref 80.0–100.0)
Platelets: 271 10*3/uL (ref 150–400)
RBC: 4.54 MIL/uL (ref 3.87–5.11)
RDW: 13.5 % (ref 11.5–15.5)
WBC: 7.3 10*3/uL (ref 4.0–10.5)
nRBC: 0 % (ref 0.0–0.2)

## 2020-07-18 LAB — LIPASE, BLOOD: Lipase: 34 U/L (ref 11–51)

## 2020-07-18 MED ORDER — POLYETHYLENE GLYCOL 3350 17 G PO PACK
17.0000 g | PACK | Freq: Every day | ORAL | 0 refills | Status: AC | PRN
Start: 2020-07-18 — End: ?

## 2020-07-18 NOTE — ED Provider Notes (Signed)
[facility] EMERGENCY DEPARTMENT Provider Note   CSN: [id number] Arrival date & time: 07/18/20  1252     History Chief Complaint  Patient presents with  . Abdominal Pain    Nina Patel is a 50 y.o. female.  HPI Patient presents with abdominal pain and right axilla pain.  Abdominal pain began a week ago.  Has been intermittent but for the last couple days been constant.  May be worse after eating.  No nausea or vomiting.  No chills.  No coughing.  No shortness of breath.  No diarrhea.  Constipation.  States she was seen in the ER for this previously and had a CT scan.  However reviewing records it looks like she has had multiple work-ups for right-sided abdominal pain without a clear cause found.  It appears that GI has diagnosed her with irritable bowel disease with constipation. Also has some pain in her right axilla.  There is some numbness going down the arm.  Feels as if there is a swelling in there.  No trauma.  Did have chickenpox as a child. Also husband was diagnosed with COVID 7 days ago.  She has had negative tests at home.  She is vaccinated    Past Medical History:  Diagnosis Date  . Hypertension   . Migraine     Patient Active Problem List   Diagnosis Date Noted  . Lumbar radiculopathy 01/21/2019  . Right elbow pain 12/17/2010    Past Surgical History:  Procedure Laterality Date  . ABDOMINAL HYSTERECTOMY    . TONSILLECTOMY       OB History   No obstetric history on file.     Family History  Problem Relation Age of Onset  . Diabetes Mother   . Diabetes Sister   . Hypertension Brother   . Diabetes Maternal Uncle   . Hypertension Maternal Uncle   . Sudden death Paternal Aunt   . Diabetes Maternal Grandfather   . Heart attack Paternal Grandfather   . Hyperlipidemia Neg Hx     Social History   Tobacco Use  . Smoking status: Never Smoker  . Smokeless tobacco: Never Used  Vaping Use  . Vaping Use: Never used  Substance Use Topics  .  Alcohol use: No  . Drug use: No    Home Medications Prior to Admission medications   Medication Sig Start Date End Date Taking? Authorizing Provider  polyethylene glycol (MIRALAX / GLYCOLAX) 17 g packet Take 17 g by mouth daily as needed for moderate constipation. 07/18/20  Yes Benjiman Core, MD  hydrochlorothiazide (HYDRODIURIL) 25 MG tablet TK 1 T PO D 10/22/17   [provider]  lisinopril (PRINIVIL,ZESTRIL) 5 MG tablet  01/17/18   [provider]  ondansetron (ZOFRAN ODT) 4 MG disintegrating tablet Take 1 tablet (4 mg total) by mouth every 8 (eight) hours as needed for nausea or vomiting. 10/12/14   Ward, Layla Maw, DO    Allergies    Sulfamethoxazole-trimethoprim, Morphine and related, and Strawberry extract  Review of Systems   Review of Systems  Constitutional: Negative for appetite change and fever.  Respiratory: Negative for shortness of breath.   Cardiovascular: Negative for chest pain.  Gastrointestinal: Positive for abdominal pain.  Genitourinary: Negative for flank pain.  Musculoskeletal: Negative for back pain.  Skin: Negative for rash.  Neurological: Negative for weakness.  Psychiatric/Behavioral: Negative for confusion.    Physical Exam Updated Vital Signs BP (!) 147/86 (BP Location: Left Arm)   Pulse 66  Temp 98.4 F (36.9 C) (Oral)   Resp 16   Ht 5' (1.524 m)   Wt 80.7 kg   SpO2 98%   BMI 34.76 kg/m   Physical Exam Vitals reviewed.  Constitutional:      Appearance: She is obese.  HENT:     Head: Normocephalic.  Cardiovascular:     Rate and Rhythm: Normal rate and regular rhythm.  Pulmonary:     Breath sounds: No wheezing.  Abdominal:     Tenderness: There is abdominal tenderness.     Comments: Right-sided abdominal tenderness no rebound or guarding.  No hernia palpated.  Skin:    General: Skin is warm.     Capillary Refill: Capillary refill takes less than 2 seconds.     Comments: Some mild tenderness to axilla.  May have  mild firmness but no induration.  No skin changes seen.  Neurovascular intact in right hand.  Neurological:     Mental Status: She is alert.     ED Results / Procedures / Treatments   Labs (all labs ordered are listed, but only abnormal results are displayed) Labs Reviewed  SARS CORONAVIRUS 2 (TAT 6-24 HRS)  LIPASE, BLOOD  COMPREHENSIVE METABOLIC PANEL  CBC  URINALYSIS, ROUTINE W REFLEX MICROSCOPIC    EKG None  Radiology US Abdomen Limited  Result Date: 07/18/2020 CLINICAL DATA:  Right upper quadrant pain. EXAM: ULTRASOUND ABDOMEN LIMITED RIGHT UPPER QUADRANT COMPARISON:  None. FINDINGS: Gallbladder: No gallstones or wall thickening visualized (0.8 mm). No sonographic Murphy sign noted by sonographer. Common bile duct: Diameter: 3.6 mm Liver: A 2.5 cm x 2.9 cm x 3.4 cm ill-defined hypoechoic area is seen within the left lobe of the liver. Diffusely increased echogenicity of the liver parenchyma is seen. Portal vein is patent on color Doppler imaging with normal direction of blood flow towards the liver. Other: None. IMPRESSION: 1. Fatty liver. 2. Ill-defined hypoechoic area within the liver which may represent a known benign hemangioma. Correlation with follow-up nonemergent abdomen and pelvis CT is recommended. Electronically Signed   By: Aram Candela M.D.   On: 07/18/2020 16:27    Procedures Procedures   Medications Ordered in ED Medications - No data to display  ED Course  I have reviewed the triage vital signs and the nursing notes.  Pertinent labs & imaging results that were available during my care of the patient were reviewed by me and considered in my medical decision making (see chart for details).    MDM Rules/Calculators/A&P                          Patient presents abdominal pain.  Right-sided.  Has had for the last week.  Lab work without abnormality.  Ultrasound done and reassuring.  Has some hemangiomas that appear to be stable.  Has had previous work-up  that was not clear cause of the pain.  Has had previous negative HIDA scan.  Per GI notes have been diagnosed with irritable bowel with constipation.  Patient states she has been constipated.  Will treat with some MiraLAX and outpatient follow-up. Doubt cholecystitis.  Doubt obstruction.  Doubt intra-abdominal infection. Also has pain in right axilla.  No erythema.  No abscess seen.  Potentially could be an early shingles.  Instructed to watch for rash.  Will discharge home with outpatient follow-up Final Clinical Impression(s) / ED Diagnoses Final diagnoses:  Right upper quadrant abdominal pain  Axillary pain, right    Rx /  DC Orders ED Discharge Orders         Ordered    polyethylene glycol (MIRALAX / GLYCOLAX) 17 g packet  Daily PRN        07/18/20 1705           Benjiman Core, MD 07/18/20 1729

## 2020-07-18 NOTE — Discharge Instructions (Signed)
Watch for development of a rash in your right arm.  Follow-up with your doctors for the abdominal pain.

## 2020-07-18 NOTE — ED Triage Notes (Addendum)
Pt states she has "knots" under right axilla and arm for a few weeks. Also having right side pain & abdominal pain with nausea. Husband tested positive for covid on 3/9. Pt states took 2 home tests and were negative

## 2020-07-19 LAB — SARS CORONAVIRUS 2 (TAT 6-24 HRS): SARS Coronavirus 2: NEGATIVE

## 2020-11-21 ENCOUNTER — Emergency Department (HOSPITAL_BASED_OUTPATIENT_CLINIC_OR_DEPARTMENT_OTHER)
Admission: EM | Admit: 2020-11-21 | Discharge: 2020-11-21 | Disposition: A | Discharge status: Home / Self Care | Payer: BC Managed Care – PPO | Attending: Emergency Medicine | Admitting: Emergency Medicine

## 2020-11-21 ENCOUNTER — Encounter (HOSPITAL_BASED_OUTPATIENT_CLINIC_OR_DEPARTMENT_OTHER): Payer: Self-pay | Admitting: *Deleted

## 2020-11-21 ENCOUNTER — Other Ambulatory Visit: Payer: Self-pay

## 2020-11-21 DIAGNOSIS — Z28310 Unvaccinated for covid-19: Secondary | ICD-10-CM | POA: Insufficient documentation

## 2020-11-21 DIAGNOSIS — U071 COVID-19: Secondary | ICD-10-CM | POA: Diagnosis not present

## 2020-11-21 DIAGNOSIS — I10 Essential (primary) hypertension: Secondary | ICD-10-CM | POA: Insufficient documentation

## 2020-11-21 DIAGNOSIS — R059 Cough, unspecified: Secondary | ICD-10-CM | POA: Diagnosis present

## 2020-11-21 MED ORDER — NIRMATRELVIR/RITONAVIR (PAXLOVID)TABLET: ORAL_TABLET | ORAL | 0 refills | Status: DC

## 2020-11-21 MED ORDER — FLUTICASONE PROPIONATE 50 MCG/ACT NA SUSP: 2.0000 | Freq: Every day | NASAL | 0 refills | Status: AC

## 2020-11-21 MED ORDER — IBUPROFEN 800 MG PO TABS
800.0000 mg | ORAL_TABLET | Freq: Once | ORAL | Status: AC
  Administered 2020-11-21: 800 mg via ORAL
  Filled 2020-11-21: qty 1

## 2020-11-21 NOTE — ED Triage Notes (Signed)
Covid + home test today , c/o h/a body aches

## 2020-11-21 NOTE — Discharge Instructions (Addendum)
You were seen in the ED today with COVID 19 symptoms. Follow with your PCP and remain in quarantine. Please take the medications as prescribed. Return with any new or worsening symptoms.

## 2020-11-21 NOTE — ED Provider Notes (Signed)
Emergency Department Provider Note   I have reviewed the triage vital signs and the nursing notes.   HISTORY  Chief Complaint Covid Positive   HPI Nina Patel is a 50 y.o. female with PMH of HTN and elevated BMI, not vaccinated for COVID, presents to the ED with cough, HA, and body aches. She took a home COVID test which came back positive today. She is not having CP or SOB symptoms. No vomiting but some diarrhea in the last 2 days. Overall symptoms present for the last 3 days. No radiation of symptoms or modifying factors. Took Motrin today with some improvement.   Past Medical History:  Diagnosis Date   Hypertension    Migraine     Patient Active Problem List   Diagnosis Date Noted   Lumbar radiculopathy 01/21/2019   Right elbow pain 12/17/2010    Past Surgical History:  Procedure Laterality Date   ABDOMINAL HYSTERECTOMY     TONSILLECTOMY      Allergies Sulfamethoxazole-trimethoprim, Morphine and related, and Strawberry extract  Family History  Problem Relation Age of Onset   Diabetes Mother    Diabetes Sister    Hypertension Brother    Diabetes Maternal Uncle    Hypertension Maternal Uncle    Sudden death Paternal Aunt    Diabetes Maternal Grandfather    Heart attack Paternal Grandfather    Hyperlipidemia Neg Hx     Social History Social History   Tobacco Use   Smoking status: Never   Smokeless tobacco: Never  Vaping Use   Vaping Use: Never used  Substance Use Topics   Alcohol use: No   Drug use: No    Review of Systems  Constitutional: No fever/chills. Positive body aches.  Eyes: No visual changes. ENT: No sore throat. Cardiovascular: Denies chest pain. Respiratory: Denies shortness of breath. Gastrointestinal: No abdominal pain.  No nausea, no vomiting.  No diarrhea.  No constipation. Genitourinary: Negative for dysuria. Musculoskeletal: Negative for back pain. Skin: Negative for rash. Neurological: Negative for focal weakness or  numbness. Positive HA.   10-point ROS otherwise negative.  ____________________________________________   PHYSICAL EXAM:  VITAL SIGNS: ED Triage Vitals  Enc Vitals Group     BP 11/21/20 2213 (!) 157/93     Pulse Rate 11/21/20 2213 78     Resp --      Temp 11/21/20 2213 98.7 F (37.1 C)     Temp Source 11/21/20 2213 Oral     SpO2 11/21/20 2213 100 %     Weight 11/21/20 2211 160 lb (72.6 kg)     Height 11/21/20 2211 5\' 3"  (1.6 m)   Constitutional: Alert and oriented. Well appearing and in no acute distress. Eyes: Conjunctivae are normal.  Head: Atraumatic. Nose: No congestion/rhinnorhea. Mouth/Throat: Mucous membranes are moist.   Neck: No stridor.   Cardiovascular: Normal rate, regular rhythm. Good peripheral circulation. Grossly normal heart sounds.   Respiratory: Normal respiratory effort.  No retractions. Lungs CTAB. Gastrointestinal: No distention.  Musculoskeletal:  No gross deformities of extremities. Neurologic:  Normal speech and language.  Skin:  Skin is warm, dry and intact. No rash noted.  ____________________________________________   PROCEDURES  Procedure(s) performed:   Procedures  None  ____________________________________________   INITIAL IMPRESSION / ASSESSMENT AND PLAN / ED COURSE  Pertinent labs & imaging results that were available during my care of the patient were reviewed by me and considered in my medical decision making (see chart for details).   Patient presents to the  ED with COVID symptoms. No SOB, CP, or hypoxemia. Overall well appearing. Patient with several risk factors for severe COVID progression and is not vaccinated. Discussed pros/cons of Paxlovid Rx. Patient agreeable to treatment course. Discussed quarantine and strict ED return precautions.   Nina Patel was evaluated in Emergency Department on 11/22/2020 for the symptoms described in the history of present illness. She was evaluated in the context of the global COVID-19  pandemic, which necessitated consideration that the patient might be at risk for infection with the SARS-CoV-2 virus that causes COVID-19. Institutional protocols and algorithms that pertain to the evaluation of patients at risk for COVID-19 are in a state of rapid change based on information released by regulatory bodies including the CDC and federal and state organizations. These policies and algorithms were followed during the patient's care in the ED.    ____________________________________________  FINAL CLINICAL IMPRESSION(S) / ED DIAGNOSES  Final diagnoses:  COVID-19     MEDICATIONS GIVEN DURING THIS VISIT:  Medications  ibuprofen (ADVIL) tablet 800 mg (800 mg Oral Given 11/21/20 2321)     NEW OUTPATIENT MEDICATIONS STARTED DURING THIS VISIT:  Discharge Medication List as of 11/21/2020 11:13 PM     START taking these medications   Details  fluticasone (FLONASE) 50 MCG/ACT nasal spray Place 2 sprays into both nostrils daily for 7 days., Starting Wed 11/21/2020, Until Wed 11/28/2020, Normal    nirmatrelvir/ritonavir EUA (PAXLOVID) TABS Patient GFR is > 60. Take nirmatrelvir (150 mg) two tablets twice daily for 5 days and ritonavir (100 mg) one tablet twice daily for 5 days., Normal        Note:  This document was prepared using Dragon voice recognition software and may include unintentional dictation errors.  Alona Bene, MD, [facility] Emergency Medicine    Latasia Silberstein, Arlyss Repress, MD 11/22/20 [phone]

## 2021-06-28 ENCOUNTER — Emergency Department (HOSPITAL_BASED_OUTPATIENT_CLINIC_OR_DEPARTMENT_OTHER): Payer: BC Managed Care – PPO

## 2021-06-28 ENCOUNTER — Encounter (HOSPITAL_BASED_OUTPATIENT_CLINIC_OR_DEPARTMENT_OTHER): Payer: Self-pay

## 2021-06-28 ENCOUNTER — Other Ambulatory Visit: Payer: Self-pay

## 2021-06-28 ENCOUNTER — Emergency Department (HOSPITAL_BASED_OUTPATIENT_CLINIC_OR_DEPARTMENT_OTHER)
Admission: EM | Admit: 2021-06-28 | Discharge: 2021-06-28 | Disposition: A | Discharge status: Home / Self Care | Payer: BC Managed Care – PPO | Attending: Emergency Medicine | Admitting: Emergency Medicine

## 2021-06-28 DIAGNOSIS — I10 Essential (primary) hypertension: Secondary | ICD-10-CM | POA: Diagnosis not present

## 2021-06-28 DIAGNOSIS — R1031 Right lower quadrant pain: Secondary | ICD-10-CM | POA: Diagnosis present

## 2021-06-28 DIAGNOSIS — R1084 Generalized abdominal pain: Secondary | ICD-10-CM | POA: Insufficient documentation

## 2021-06-28 DIAGNOSIS — Z79899 Other long term (current) drug therapy: Secondary | ICD-10-CM | POA: Diagnosis not present

## 2021-06-28 LAB — URINALYSIS, ROUTINE W REFLEX MICROSCOPIC
Bilirubin Urine: NEGATIVE
Glucose, UA: NEGATIVE mg/dL
Hgb urine dipstick: NEGATIVE
Ketones, ur: NEGATIVE mg/dL
Leukocytes,Ua: NEGATIVE
Nitrite: NEGATIVE
Protein, ur: NEGATIVE mg/dL
Specific Gravity, Urine: >=1.03 (ref 1.005–1.030)
pH: 5.5 (ref 5.0–8.0)

## 2021-06-28 LAB — COMPREHENSIVE METABOLIC PANEL
ALT: 24 U/L (ref 0–44)
AST: 17 U/L (ref 15–41)
Albumin: 4.2 g/dL (ref 3.5–5.0)
Alkaline Phosphatase: 120 U/L (ref 38–126)
Anion gap: 6 (ref 5–15)
BUN: 10 mg/dL (ref 6–20)
CO2: 27 mmol/L (ref 22–32)
Calcium: 9.1 mg/dL (ref 8.9–10.3)
Chloride: 105 mmol/L (ref 98–111)
Creatinine, Ser: 0.71 mg/dL (ref 0.44–1.00)
GFR, Estimated: >60 mL/min (ref >60)
Glucose, Bld: 93 mg/dL (ref 70–99)
Potassium: 3.9 mmol/L (ref 3.5–5.1)
Sodium: 138 mmol/L (ref 135–145)
Total Bilirubin: 0.3 mg/dL (ref 0.3–1.2)
Total Protein: 8 g/dL (ref 6.5–8.1)

## 2021-06-28 LAB — CBC
HCT: 41.2 % (ref 36.0–46.0)
Hemoglobin: 13.1 g/dL (ref 12.0–15.0)
MCH: 28.2 pg (ref 26.0–34.0)
MCHC: 31.8 g/dL (ref 30.0–36.0)
MCV: 88.6 fL (ref 80.0–100.0)
Platelets: 252 10*3/uL (ref 150–400)
RBC: 4.65 MIL/uL (ref 3.87–5.11)
RDW: 13.6 % (ref 11.5–15.5)
WBC: 6.6 10*3/uL (ref 4.0–10.5)
nRBC: 0 % (ref 0.0–0.2)

## 2021-06-28 LAB — LIPASE, BLOOD: Lipase: 39 U/L (ref 11–51)

## 2021-06-28 MED ORDER — ONDANSETRON HCL 4 MG PO TABS: 4.0000 mg | ORAL_TABLET | Freq: Three times a day (TID) | ORAL | 0 refills | Status: DC | PRN

## 2021-06-28 MED ORDER — IOHEXOL 300 MG/ML  SOLN
100.0000 mL | Freq: Once | INTRAMUSCULAR | Status: AC | PRN
  Administered 2021-06-28: 100 mL via INTRAVENOUS

## 2021-06-28 MED ORDER — ONDANSETRON HCL 4 MG/2ML IJ SOLN
4.0000 mg | Freq: Once | INTRAMUSCULAR | Status: AC
  Administered 2021-06-28: 4 mg via INTRAVENOUS
  Filled 2021-06-28: qty 2

## 2021-06-28 MED ORDER — ACETAMINOPHEN 500 MG PO TABS
1000.0000 mg | ORAL_TABLET | Freq: Once | ORAL | Status: AC
Start: 2021-06-28 — End: 2021-06-28
  Administered 2021-06-28: 1000 mg via ORAL
  Filled 2021-06-28: qty 2

## 2021-06-28 MED ORDER — SODIUM CHLORIDE 0.9 % IV BOLUS
1000.0000 mL | Freq: Once | INTRAVENOUS | Status: AC
  Administered 2021-06-28: 1000 mL via INTRAVENOUS

## 2021-06-28 NOTE — ED Provider Notes (Signed)
Patient signed out to me by previous provider. Please refer to their note for full HPI.  Briefly this is a 51 year old female who presents emergency department with abdominal pain.  Blood work was reassuring, we are pending CT scan with IV contrast due to ongoing abdominal pain. Physical Exam  BP (!) 148/86    Pulse (!) 58    Temp 98 F (36.7 C) (Oral)    Resp 18    Ht 5' (1.524 m)    Wt 82.6 kg    SpO2 100%    BMI 35.54 kg/m   Physical Exam Vitals and nursing note reviewed.  Constitutional:      General: She is not in acute distress.    Appearance: Normal appearance.  HENT:     Head: Normocephalic.     Mouth/Throat:     Mouth: Mucous membranes are moist.  Cardiovascular:     Rate and Rhythm: Normal rate.  Pulmonary:     Effort: Pulmonary effort is normal. No respiratory distress.  Abdominal:     Palpations: Abdomen is soft.     Tenderness: There is generalized abdominal tenderness. There is no guarding or rebound.  Skin:    General: Skin is warm.  Neurological:     Mental Status: She is alert and oriented to person, place, and time. Mental status is at baseline.  Psychiatric:        Mood and Affect: Mood normal.    Procedures  Procedures  ED Course / MDM    Medical Decision Making Amount and/or Complexity of Data Reviewed Labs: ordered. Radiology: ordered.  Risk OTC drugs. Prescription drug management.   CT scan shows no acute finding.  On reevaluation patient's symptoms are improved.  Plan for outpatient GI follow-up with strict return to ED precautions.  Patient at this time appears safe and stable for discharge and close outpatient follow up. Discharge plan and strict return to ED precautions discussed, patient verbalizes understanding and agreement.       Rozelle Logan, DO 06/28/21 1800

## 2021-06-28 NOTE — ED Provider Notes (Signed)
[facility] EMERGENCY DEPARTMENT Provider Note   CSN: [id number] Arrival date & time: 06/28/21  1254     History  Chief Complaint  Patient presents with   Abdominal Pain    Nina Patel is a 51 y.o. female.  Pt is a 51 yo aa female with a hx of migraines and htn.  Pt has had rlq pain for 3 days.  She has had some hot flashes, but no fevers.  She has felt nauseous, but no vomiting.        Home Medications Prior to Admission medications   Medication Sig Start Date End Date Taking? Authorizing Provider  fluticasone (FLONASE) 50 MCG/ACT nasal spray Place 2 sprays into both nostrils daily for 7 days. 11/21/20 11/28/20  Long, Arlyss Repress, MD  hydrochlorothiazide (HYDRODIURIL) 25 MG tablet TK 1 T PO D 10/22/17   [provider]  lisinopril (PRINIVIL,ZESTRIL) 5 MG tablet  01/17/18   [provider]  nirmatrelvir/ritonavir EUA (PAXLOVID) TABS Patient GFR is > 60. Take nirmatrelvir (150 mg) two tablets twice daily for 5 days and ritonavir (100 mg) one tablet twice daily for 5 days. 11/21/20   Long, Arlyss Repress, MD  ondansetron (ZOFRAN ODT) 4 MG disintegrating tablet Take 1 tablet (4 mg total) by mouth every 8 (eight) hours as needed for nausea or vomiting. 10/12/14   Ward, Layla Maw, DO  polyethylene glycol (MIRALAX / GLYCOLAX) 17 g packet Take 17 g by mouth daily as needed for moderate constipation. 07/18/20   Benjiman Core, MD      Allergies    Sulfamethoxazole-trimethoprim, Morphine and related, and Strawberry extract    Review of Systems   Review of Systems  Gastrointestinal:  Positive for abdominal pain and nausea.  All other systems reviewed and are negative.  Physical Exam Updated Vital Signs BP 139/76    Pulse (!) 58    Temp 98 F (36.7 C) (Oral)    Resp 18    Ht 5' (1.524 m)    Wt 82.6 kg    SpO2 99%    BMI 35.54 kg/m  Physical Exam Vitals and nursing note reviewed.  Constitutional:      Appearance: She is well-developed.  HENT:     Head:  Normocephalic and atraumatic.     Mouth/Throat:     Mouth: Mucous membranes are moist.     Pharynx: Oropharynx is clear.  Eyes:     Extraocular Movements: Extraocular movements intact.     Pupils: Pupils are equal, round, and reactive to light.  Cardiovascular:     Rate and Rhythm: Normal rate and regular rhythm.     Heart sounds: Normal heart sounds.  Pulmonary:     Effort: Pulmonary effort is normal.     Breath sounds: Normal breath sounds.  Abdominal:     General: Abdomen is flat. Bowel sounds are normal.     Palpations: Abdomen is soft.     Tenderness: There is abdominal tenderness in the right upper quadrant and right lower quadrant.  Skin:    General: Skin is warm.     Capillary Refill: Capillary refill takes less than 2 seconds.  Neurological:     General: No focal deficit present.     Mental Status: She is alert and oriented to person, place, and time.  Psychiatric:        Mood and Affect: Mood normal.        Behavior: Behavior normal.    ED Results / Procedures / Treatments  Labs (all labs ordered are listed, but only abnormal results are displayed) Labs Reviewed  LIPASE, BLOOD  COMPREHENSIVE METABOLIC PANEL  CBC  URINALYSIS, ROUTINE W REFLEX MICROSCOPIC    EKG None  Radiology No results found.  Procedures Procedures    Medications Ordered in ED Medications  sodium chloride 0.9 % bolus 1,000 mL (0 mLs Intravenous Stopped 06/28/21 1529)  ondansetron (ZOFRAN) injection 4 mg (4 mg Intravenous Given 06/28/21 1413)    ED Course/ Medical Decision Making/ A&P                           Medical Decision Making Amount and/or Complexity of Data Reviewed Labs: ordered. Radiology: ordered.  Risk Prescription drug management.   This patient presents to the ED for concern of abd pain, this involves an extensive number of treatment options, and is a complaint that carries with it a high risk of complications and morbidity.  The differential diagnosis includes  appendicitis, gb, uti, kidney stone, other infection   Co morbidities that complicate the patient evaluation  Obesity, htn   Additional history obtained:  Additional history obtained from epic chart review   Lab Tests:  I Ordered, and personally interpreted labs.  The pertinent results include:  ua neg, cbc neg, cmp nl   Imaging Studies ordered:  I ordered imaging studies including ct abd/pelvis  I independently visualized and interpreted imaging which showed pending at shift change I agree with the radiologist interpretation   Cardiac Monitoring:  The patient was maintained on a cardiac monitor.  I personally viewed and interpreted the cardiac monitored which showed an underlying rhythm of: nsr   Medicines ordered and prescription drug management:  I ordered medication including inapsine/IVFs  for n/v  Reevaluation of the patient after these medicines showed that the patient improved I have reviewed the patients home medicines and have made adjustments as needed   Test Considered:  Ct abd/pelvis:  Pt still with pain, so this is ordered   Problem List / ED Course:  Abd pain:  Pt is no longer nauseous, but she continues to have pain.  Labs nl.  Ct ordered   Reevaluation:  After the interventions noted above, I reevaluated the patient and found that they have :improved       Final Clinical Impression(s) / ED Diagnoses Final diagnoses:  Generalized abdominal pain    Rx / DC Orders ED Discharge Orders     None         Jacalyn Lefevre, MD 06/28/21 1536

## 2021-06-28 NOTE — ED Notes (Signed)
Discharge instructions discussed with pt. Pt verbalized understanding. Pt stable and ambulatory.  °

## 2021-06-28 NOTE — ED Triage Notes (Signed)
Pt c/o RLQ pain, n/d x 3 days-NAD-steady gait

## 2021-06-28 NOTE — Discharge Instructions (Signed)
You have been seen and discharged from the emergency department.  Your blood work and CAT scan were normal.  Follow-up with your primary provider for further evaluation/testing and further care. Take home medications as prescribed. If you have any worsening symptoms or further concerns for your health please return to an emergency department for further evaluation.

## 2021-10-30 ENCOUNTER — Encounter (HOSPITAL_BASED_OUTPATIENT_CLINIC_OR_DEPARTMENT_OTHER): Payer: Self-pay | Admitting: Emergency Medicine

## 2021-10-30 ENCOUNTER — Emergency Department (HOSPITAL_BASED_OUTPATIENT_CLINIC_OR_DEPARTMENT_OTHER)
Admission: EM | Admit: 2021-10-30 | Discharge: 2021-10-30 | Disposition: A | Discharge status: Home / Self Care | Payer: BC Managed Care – PPO | Attending: Emergency Medicine | Admitting: Emergency Medicine

## 2021-10-30 ENCOUNTER — Other Ambulatory Visit: Payer: Self-pay

## 2021-10-30 DIAGNOSIS — R112 Nausea with vomiting, unspecified: Secondary | ICD-10-CM | POA: Diagnosis present

## 2021-10-30 DIAGNOSIS — N390 Urinary tract infection, site not specified: Secondary | ICD-10-CM | POA: Insufficient documentation

## 2021-10-30 DIAGNOSIS — L304 Erythema intertrigo: Secondary | ICD-10-CM | POA: Diagnosis not present

## 2021-10-30 LAB — CBC WITH DIFFERENTIAL/PLATELET
Abs Immature Granulocytes: 0.01 10*3/uL (ref 0.00–0.07)
Basophils Absolute: 0 10*3/uL (ref 0.0–0.1)
Basophils Relative: 1 %
Eosinophils Absolute: 0.1 10*3/uL (ref 0.0–0.5)
Eosinophils Relative: 2 %
HCT: 41.2 % (ref 36.0–46.0)
Hemoglobin: 13.1 g/dL (ref 12.0–15.0)
Immature Granulocytes: 0 %
Lymphocytes Relative: 47 %
Lymphs Abs: 3.2 10*3/uL (ref 0.7–4.0)
MCH: 28.2 pg (ref 26.0–34.0)
MCHC: 31.8 g/dL (ref 30.0–36.0)
MCV: 88.8 fL (ref 80.0–100.0)
Monocytes Absolute: 0.4 10*3/uL (ref 0.1–1.0)
Monocytes Relative: 5 %
Neutro Abs: 3 10*3/uL (ref 1.7–7.7)
Neutrophils Relative %: 45 %
Platelets: 231 10*3/uL (ref 150–400)
RBC: 4.64 MIL/uL (ref 3.87–5.11)
RDW: 13.5 % (ref 11.5–15.5)
WBC: 6.7 10*3/uL (ref 4.0–10.5)
nRBC: 0 % (ref 0.0–0.2)

## 2021-10-30 LAB — COMPREHENSIVE METABOLIC PANEL
ALT: 27 U/L (ref 0–44)
AST: 20 U/L (ref 15–41)
Albumin: 3.7 g/dL (ref 3.5–5.0)
Alkaline Phosphatase: 113 U/L (ref 38–126)
Anion gap: 6 (ref 5–15)
BUN: 9 mg/dL (ref 6–20)
CO2: 26 mmol/L (ref 22–32)
Calcium: 9.2 mg/dL (ref 8.9–10.3)
Chloride: 108 mmol/L (ref 98–111)
Creatinine, Ser: 0.74 mg/dL (ref 0.44–1.00)
GFR, Estimated: >60 mL/min (ref >60)
Glucose, Bld: 113 mg/dL — ABNORMAL HIGH (ref 70–99)
Potassium: 3.5 mmol/L (ref 3.5–5.1)
Sodium: 140 mmol/L (ref 135–145)
Total Bilirubin: 0.3 mg/dL (ref 0.3–1.2)
Total Protein: 7.4 g/dL (ref 6.5–8.1)

## 2021-10-30 LAB — URINALYSIS, MICROSCOPIC (REFLEX)

## 2021-10-30 LAB — URINALYSIS, ROUTINE W REFLEX MICROSCOPIC
Bilirubin Urine: NEGATIVE
Glucose, UA: NEGATIVE mg/dL
Hgb urine dipstick: NEGATIVE
Ketones, ur: NEGATIVE mg/dL
Nitrite: NEGATIVE
Protein, ur: NEGATIVE mg/dL
Specific Gravity, Urine: >=1.03 (ref 1.005–1.030)
pH: 6 (ref 5.0–8.0)

## 2021-10-30 LAB — LIPASE, BLOOD: Lipase: 39 U/L (ref 11–51)

## 2021-10-30 MED ORDER — CEPHALEXIN 500 MG PO CAPS: 500.0000 mg | ORAL_CAPSULE | Freq: Two times a day (BID) | ORAL | 0 refills | Status: DC

## 2021-10-30 MED ORDER — ONDANSETRON 4 MG PO TBDP
ORAL_TABLET | ORAL | 0 refills | Status: DC
Start: 2021-10-30 — End: 2022-09-01

## 2021-10-30 MED ORDER — ONDANSETRON HCL 4 MG/2ML IJ SOLN
4.0000 mg | Freq: Once | INTRAMUSCULAR | Status: AC
  Administered 2021-10-30: 4 mg via INTRAVENOUS
  Filled 2021-10-30: qty 2

## 2021-10-30 MED ORDER — CLOTRIMAZOLE-BETAMETHASONE 1-0.05 % EX CREA: TOPICAL_CREAM | CUTANEOUS | 0 refills | Status: AC

## 2021-10-30 MED ORDER — SODIUM CHLORIDE 0.9 % IV BOLUS
1000.0000 mL | Freq: Once | INTRAVENOUS | Status: AC
  Administered 2021-10-30: 1000 mL via INTRAVENOUS

## 2021-10-30 NOTE — ED Provider Notes (Signed)
[facility] EMERGENCY DEPARTMENT Provider Note   CSN: [id number] Arrival date & time: 10/30/21  0116     History  No chief complaint on file.   Nina Patel is a 51 y.o. female.  Patient presents to the emergency department with complaints of nausea, vomiting, diarrhea that has been present for 2 days.  She has been experiencing persistent nausea and inability to eat and drink.  She intermittently has sharp cramping pains in the abdomen that are relieved by diarrhea episodes.  No hematemesis or rectal bleeding.  Patient also reports that she has a rash under both of her breasts.  This has been present for a couple of weeks.  She has been using hydrocortisone cream without improvement.       Home Medications Prior to Admission medications   Medication Sig Start Date End Date Taking? Authorizing Provider  cephALEXin (KEFLEX) 500 MG capsule Take 1 capsule (500 mg total) by mouth 2 (two) times daily. 10/30/21  Yes Elaynah Virginia, Canary Brim, MD  clotrimazole-betamethasone (LOTRISONE) cream Apply to affected area 2 times daily to rash under breasts until rash is gone. 10/30/21  Yes Dell Hurtubise, Canary Brim, MD  ondansetron (ZOFRAN-ODT) 4 MG disintegrating tablet 4mg  ODT q4 hours prn nausea/vomit 10/30/21  Yes Sharone Almond, 11/01/21, MD  fluticasone (FLONASE) 50 MCG/ACT nasal spray Place 2 sprays into both nostrils daily for 7 days. 11/21/20 11/28/20  Long, 11/30/20, MD  hydrochlorothiazide (HYDRODIURIL) 25 MG tablet TK 1 T PO D 10/22/17   [provider]  lisinopril (PRINIVIL,ZESTRIL) 5 MG tablet  01/17/18   [provider]  ondansetron (ZOFRAN) 4 MG tablet Take 1 tablet (4 mg total) by mouth every 8 (eight) hours as needed for nausea or vomiting. 06/28/21   Horton, 06/30/21, DO  polyethylene glycol (MIRALAX / GLYCOLAX) 17 g packet Take 17 g by mouth daily as needed for moderate constipation. 07/18/20   07/20/20, MD      Allergies    Sulfamethoxazole-trimethoprim,  Morphine and related, and Strawberry extract    Review of Systems   Review of Systems  Physical Exam Updated Vital Signs BP (!) 155/100 (BP Location: Right Arm)   Pulse 77   Temp 98 F (36.7 C) (Oral)   Resp 18   Ht 5' (1.524 m)   Wt 80.7 kg   SpO2 100%   BMI 34.76 kg/m  Physical Exam Vitals and nursing note reviewed.  Constitutional:      General: She is not in acute distress.    Appearance: She is well-developed.  HENT:     Head: Normocephalic and atraumatic.     Mouth/Throat:     Mouth: Mucous membranes are moist.  Eyes:     General: Vision grossly intact. Gaze aligned appropriately.     Extraocular Movements: Extraocular movements intact.     Conjunctiva/sclera: Conjunctivae normal.  Cardiovascular:     Rate and Rhythm: Normal rate and regular rhythm.     Pulses: Normal pulses.     Heart sounds: Normal heart sounds, S1 normal and S2 normal. No murmur heard.    No friction rub. No gallop.  Pulmonary:     Effort: Pulmonary effort is normal. No respiratory distress.     Breath sounds: Normal breath sounds.  Abdominal:     General: Bowel sounds are normal.     Palpations: Abdomen is soft.     Tenderness: There is no abdominal tenderness. There is no guarding or rebound.     Hernia: No  hernia is present.  Musculoskeletal:        General: No swelling.     Cervical back: Full passive range of motion without pain, normal range of motion and neck supple. No spinous process tenderness or muscular tenderness. Normal range of motion.     Right lower leg: No edema.     Left lower leg: No edema.  Skin:    General: Skin is warm and dry.     Capillary Refill: Capillary refill takes less than 2 seconds.     Findings: Rash (Hyperpigmented, slightly thickened, weeping irregularly bordered rash bilateral skin folds under breasts) present. No ecchymosis, erythema or wound.  Neurological:     General: No focal deficit present.     Mental Status: She is alert and oriented to  person, place, and time.     GCS: GCS eye subscore is 4. GCS verbal subscore is 5. GCS motor subscore is 6.     Cranial Nerves: Cranial nerves 2-12 are intact.     Sensory: Sensation is intact.     Motor: Motor function is intact.     Coordination: Coordination is intact.  Psychiatric:        Attention and Perception: Attention normal.        Mood and Affect: Mood normal.        Speech: Speech normal.        Behavior: Behavior normal.     ED Results / Procedures / Treatments   Labs (all labs ordered are listed, but only abnormal results are displayed) Labs Reviewed  COMPREHENSIVE METABOLIC PANEL - Abnormal; Notable for the following components:      Result Value   Glucose, Bld 113 (*)    All other components within normal limits  URINALYSIS, ROUTINE W REFLEX MICROSCOPIC - Abnormal; Notable for the following components:   APPearance HAZY (*)    Leukocytes,Ua SMALL (*)    All other components within normal limits  URINALYSIS, MICROSCOPIC (REFLEX) - Abnormal; Notable for the following components:   Bacteria, UA MANY (*)    All other components within normal limits  CBC WITH DIFFERENTIAL/PLATELET  LIPASE, BLOOD    EKG None  Radiology No results found.  Procedures Procedures    Medications Ordered in ED Medications  sodium chloride 0.9 % bolus 1,000 mL ( Intravenous Stopped 10/30/21 0358)  ondansetron (ZOFRAN) injection 4 mg (4 mg Intravenous Given 10/30/21 0252)    ED Course/ Medical Decision Making/ A&P                           Medical Decision Making Amount and/or Complexity of Data Reviewed Labs: ordered.  Risk Prescription drug management.   Presents to the emergency department for evaluation of nausea, vomiting and diarrhea.  Patient with intermittent abdominal cramping but a benign abdominal exam at arrival.  Differential diagnosis considered includes gastroenteritis, gastritis, small bowel obstruction, colitis, diverticulitis.  Blood work is  reassuring.  Urinalysis equivocal, difficult to rule out urinary tract infection.  Abdominal exam is benign, however.  No focal tenderness.  No guarding or rebound.  With no fever, no leukocytosis, normal blood work, does not require imaging.        Final Clinical Impression(s) / ED Diagnoses Final diagnoses:  Nausea vomiting and diarrhea  Intertrigo  Urinary tract infection without hematuria, site unspecified    Rx / DC Orders ED Discharge Orders          Ordered  clotrimazole-betamethasone (LOTRISONE) cream        10/30/21 0153    ondansetron (ZOFRAN-ODT) 4 MG disintegrating tablet        10/30/21 0408    cephALEXin (KEFLEX) 500 MG capsule  2 times daily        10/30/21 0409              Gilda Crease, MD 10/30/21 [phone]

## 2021-10-30 NOTE — ED Triage Notes (Signed)
Rash under breast X 2 weeks started on one side was getting better but then spread. Having diarrhea and emesis for 2 days.

## 2022-04-30 ENCOUNTER — Other Ambulatory Visit: Payer: Self-pay

## 2022-04-30 ENCOUNTER — Emergency Department (HOSPITAL_BASED_OUTPATIENT_CLINIC_OR_DEPARTMENT_OTHER)
Admission: EM | Admit: 2022-04-30 | Discharge: 2022-04-30 | Disposition: A | Discharge status: Home / Self Care | Payer: BC Managed Care – PPO | Attending: Emergency Medicine | Admitting: Emergency Medicine

## 2022-04-30 ENCOUNTER — Encounter (HOSPITAL_BASED_OUTPATIENT_CLINIC_OR_DEPARTMENT_OTHER): Payer: Self-pay | Admitting: Emergency Medicine

## 2022-04-30 DIAGNOSIS — M545 Low back pain, unspecified: Secondary | ICD-10-CM | POA: Diagnosis present

## 2022-04-30 DIAGNOSIS — M5432 Sciatica, left side: Secondary | ICD-10-CM

## 2022-04-30 DIAGNOSIS — I10 Essential (primary) hypertension: Secondary | ICD-10-CM | POA: Diagnosis not present

## 2022-04-30 DIAGNOSIS — Z79899 Other long term (current) drug therapy: Secondary | ICD-10-CM | POA: Diagnosis not present

## 2022-04-30 DIAGNOSIS — M5442 Lumbago with sciatica, left side: Secondary | ICD-10-CM | POA: Diagnosis not present

## 2022-04-30 HISTORY — DX: Radiculopathy, lumbar region: M54.16

## 2022-04-30 MED ORDER — HYDROCODONE-ACETAMINOPHEN 5-325 MG PO TABS: 1.0000 | ORAL_TABLET | Freq: Four times a day (QID) | ORAL | 0 refills | Status: AC | PRN

## 2022-04-30 NOTE — ED Provider Notes (Signed)
[facility] Provider Note: Lowella Dell, MD, FACEP  CSN: [id number] MRN: [id number] ARRIVAL: 04/30/22 at 0439 ROOM: [facility]04/[facility]04   CHIEF COMPLAINT  Back Pain   HISTORY OF PRESENT ILLNESS  04/30/22 4:55 AM Nina Patel is a 51 y.o. female with a history of lumbar radiculopathy.  She is here with 2 days of left lumbar pain radiating to her left groin and down the back of her left leg.  She is having some paresthesias in the first 3 toes of her left foot.  She denies any weakness.  She denies any bowel or bladder changes.  She denies any paresthesias of the perineum.  She denies injury.  She rates her pain as a 10 out of 10, worse with movement.   Past Medical History:  Diagnosis Date   Hypertension    Lumbar radiculopathy    Migraine     Past Surgical History:  Procedure Laterality Date   ABDOMINAL HYSTERECTOMY     TONSILLECTOMY      Family History  Problem Relation Age of Onset   Diabetes Mother    Diabetes Sister    Hypertension Brother    Diabetes Maternal Uncle    Hypertension Maternal Uncle    Sudden death Paternal Aunt    Diabetes Maternal Grandfather    Heart attack Paternal Grandfather    Hyperlipidemia Neg Hx     Social History   Tobacco Use   Smoking status: Never   Smokeless tobacco: Never  Vaping Use   Vaping Use: Never used  Substance Use Topics   Alcohol use: No   Drug use: No    Prior to Admission medications   Medication Sig Start Date End Date Taking? Authorizing Provider  HYDROcodone-acetaminophen (NORCO) 5-325 MG tablet Take 1 tablet by mouth every 6 (six) hours as needed for severe pain. 04/30/22  Yes Fernando Torry, MD  clotrimazole-betamethasone (LOTRISONE) cream Apply to affected area 2 times daily to rash under breasts until rash is gone. 10/30/21   Gilda Crease, MD  fluticasone (FLONASE) 50 MCG/ACT nasal spray Place 2 sprays into both nostrils daily for 7 days. 11/21/20 11/28/20  Long, Arlyss Repress, MD   hydrochlorothiazide (HYDRODIURIL) 25 MG tablet TK 1 T PO D 10/22/17   [provider]  lisinopril (PRINIVIL,ZESTRIL) 5 MG tablet  01/17/18   [provider]  ondansetron (ZOFRAN-ODT) 4 MG disintegrating tablet 4mg  ODT q4 hours prn nausea/vomit 10/30/21   Pollina, 11/01/21, MD  polyethylene glycol (MIRALAX / GLYCOLAX) 17 g packet Take 17 g by mouth daily as needed for moderate constipation. 07/18/20   07/20/20, MD    Allergies Sulfamethoxazole-trimethoprim, Morphine and related, Strawberry extract, and Tramadol   REVIEW OF SYSTEMS  Negative except as noted here or in the History of Present Illness.   PHYSICAL EXAMINATION  Initial Vital Signs Blood pressure (!) 157/83, pulse 70, temperature 98.2 F (36.8 C), temperature source Oral, resp. rate 18, height 5' (1.524 m), weight 77.1 kg, SpO2 100 %.  Examination General: Well-developed, well-nourished female in no acute distress; appearance consistent with age of record HENT: normocephalic; atraumatic Eyes: Normal appearance Neck: supple Heart: regular rate and rhythm Lungs: clear to auscultation bilaterally;  Abdomen: soft; nondistended; nontender; bowel sounds present Back: Positive straight leg raise on the left Extremities: No deformity; full range of motion; pulses normal Neurologic: Awake, alert and oriented; motor function intact in all extremities and symmetric except limited exam of left lower extremity due to pain on movement of left  hip; no facial droop Skin: Warm and dry Psychiatric: Normal mood and affect   RESULTS  Summary of this visit's results, reviewed and interpreted by myself:   EKG Interpretation  Date/Time:    Ventricular Rate:    PR Interval:    QRS Duration:   QT Interval:    QTC Calculation:   R Axis:     Text Interpretation:         Laboratory Studies: No results found for this or any previous visit (from the past 24 hour(s)). Imaging Studies: No results  found.  ED COURSE and MDM  Nursing notes, initial and subsequent vitals signs, including pulse oximetry, reviewed and interpreted by myself.  Vitals:   04/30/22 0452 04/30/22 0453  BP:  (!) 157/83  Pulse:  70  Resp:  18  Temp:  98.2 F (36.8 C)  TempSrc:  Oral  SpO2:  100%  Weight: 77.1 kg   Height: 5' (1.524 m)    Medications - No data to display  Presentation consistent with acute sciatica.  Patient does have a primary care physician with whom she can follow-up.  She was advised that if symptoms persist she may need an MRI and possible referral to a neurosurgeon.  No signs of cauda equina syndrome at this time.    PROCEDURES  Procedures   ED DIAGNOSES     ICD-10-CM   1. Sciatica of left side  M54.32          Basya Casavant, MD 04/30/22 [phone]

## 2022-04-30 NOTE — ED Triage Notes (Addendum)
Back pain, left hip pain, goes down left leg with numbnes in fingers and toes. X 2 days progressively gotten worse denies injury has been taking ibuprofen at home.

## 2022-08-13 ENCOUNTER — Encounter (HOSPITAL_BASED_OUTPATIENT_CLINIC_OR_DEPARTMENT_OTHER): Payer: Self-pay

## 2022-08-13 ENCOUNTER — Emergency Department (HOSPITAL_BASED_OUTPATIENT_CLINIC_OR_DEPARTMENT_OTHER)
Admission: EM | Admit: 2022-08-13 | Discharge: 2022-08-13 | Disposition: A | Discharge status: Home / Self Care | Payer: BC Managed Care – PPO | Attending: Emergency Medicine | Admitting: Emergency Medicine

## 2022-08-13 ENCOUNTER — Other Ambulatory Visit: Payer: Self-pay

## 2022-08-13 DIAGNOSIS — Z79899 Other long term (current) drug therapy: Secondary | ICD-10-CM | POA: Diagnosis not present

## 2022-08-13 DIAGNOSIS — J069 Acute upper respiratory infection, unspecified: Secondary | ICD-10-CM

## 2022-08-13 DIAGNOSIS — M791 Myalgia, unspecified site: Secondary | ICD-10-CM | POA: Diagnosis not present

## 2022-08-13 DIAGNOSIS — J029 Acute pharyngitis, unspecified: Secondary | ICD-10-CM | POA: Diagnosis not present

## 2022-08-13 DIAGNOSIS — R197 Diarrhea, unspecified: Secondary | ICD-10-CM | POA: Insufficient documentation

## 2022-08-13 DIAGNOSIS — Z1152 Encounter for screening for COVID-19: Secondary | ICD-10-CM | POA: Diagnosis not present

## 2022-08-13 DIAGNOSIS — R059 Cough, unspecified: Secondary | ICD-10-CM | POA: Insufficient documentation

## 2022-08-13 DIAGNOSIS — I10 Essential (primary) hypertension: Secondary | ICD-10-CM | POA: Insufficient documentation

## 2022-08-13 LAB — RESP PANEL BY RT-PCR (RSV, FLU A&B, COVID)  RVPGX2
Influenza A by PCR: NEGATIVE
Influenza B by PCR: NEGATIVE
Resp Syncytial Virus by PCR: NEGATIVE
SARS Coronavirus 2 by RT PCR: NEGATIVE

## 2022-08-13 MED ORDER — BENZONATATE 100 MG PO CAPS: 100.0000 mg | ORAL_CAPSULE | Freq: Three times a day (TID) | ORAL | 0 refills | Status: AC

## 2022-08-13 MED ORDER — ACETAMINOPHEN 160 MG/5ML PO SOLN
650.0000 mg | Freq: Once | ORAL | Status: AC
  Administered 2022-08-13: 650 mg via ORAL
  Filled 2022-08-13: qty 20.3

## 2022-08-13 MED ORDER — LIDOCAINE VISCOUS HCL 2 % MT SOLN: 5.0000 mL | Freq: Three times a day (TID) | OROMUCOSAL | 0 refills | Status: AC | PRN

## 2022-08-13 MED ORDER — LORATADINE 10 MG PO TABS
10.0000 mg | ORAL_TABLET | Freq: Once | ORAL | Status: AC
  Administered 2022-08-13: 10 mg via ORAL
  Filled 2022-08-13: qty 1

## 2022-08-13 MED ORDER — BENZONATATE 100 MG PO CAPS
200.0000 mg | ORAL_CAPSULE | Freq: Once | ORAL | Status: AC
  Administered 2022-08-13: 200 mg via ORAL
  Filled 2022-08-13: qty 2

## 2022-08-13 NOTE — ED Triage Notes (Signed)
Cough beginning 2 days ago that seems like its been getting worse. Fever of 102 two days ago but not since.   Has been taking tylenol as needed. Last dose 2200 last night.

## 2022-08-13 NOTE — ED Provider Notes (Signed)
[place] [facility] Provider Note   CSN: [id number] Arrival date & time: 08/13/22  0603     History  Chief Complaint  Patient presents with   Cough    Nina Patel is a 52 y.o. female.  The history is provided by the patient.  Cough Severity:  Moderate Onset quality:  Gradual Duration:  2 days Timing:  Intermittent Progression:  Unchanged Chronicity:  New Context: upper respiratory infection   Relieved by:  Nothing Worsened by:  Nothing Ineffective treatments:  None tried Associated symptoms: myalgias, sinus congestion and sore throat   Associated symptoms: no chest pain and no diaphoresis   Associated symptoms comment:  Diarrhea and body aches  Risk factors: no chemical exposure   Patient with HTN presents with cough, congestion, sore throat and body aches that began 2 days ago.  Also had some diarrhea.  Reports fever initially but now gone.    Past Medical History:  Diagnosis Date   Hypertension    Lumbar radiculopathy    Migraine         Home Medications Prior to Admission medications   Medication Sig Start Date End Date Taking? Authorizing Provider  clotrimazole-betamethasone (LOTRISONE) cream Apply to affected area 2 times daily to rash under breasts until rash is gone. 10/30/21   Gilda Crease, MD  fluticasone (FLONASE) 50 MCG/ACT nasal spray Place 2 sprays into both nostrils daily for 7 days. 11/21/20 11/28/20  Long, Arlyss Repress, MD  hydrochlorothiazide (HYDRODIURIL) 25 MG tablet TK 1 T PO D 10/22/17   [provider]  HYDROcodone-acetaminophen (NORCO) 5-325 MG tablet Take 1 tablet by mouth every 6 (six) hours as needed for severe pain. 04/30/22   Molpus, John, MD  lisinopril (PRINIVIL,ZESTRIL) 5 MG tablet  01/17/18   [provider]  ondansetron (ZOFRAN-ODT) 4 MG disintegrating tablet 4mg  ODT q4 hours prn nausea/vomit 10/30/21   Pollina, Canary Brim, MD  polyethylene glycol (MIRALAX / GLYCOLAX) 17  g packet Take 17 g by mouth daily as needed for moderate constipation. 07/18/20   Benjiman Core, MD      Allergies    Sulfamethoxazole-trimethoprim, Morphine, Morphine and related, Strawberry extract, and Tramadol    Review of Systems   Review of Systems  Constitutional:  Negative for diaphoresis.  HENT:  Positive for congestion and sore throat. Negative for voice change.   Respiratory:  Positive for cough. Negative for stridor.   Cardiovascular:  Negative for chest pain.  Gastrointestinal:  Positive for diarrhea.  Musculoskeletal:  Positive for myalgias.  All other systems reviewed and are negative.   Physical Exam Updated Vital Signs BP (!) 174/93   Pulse 88   Temp 98.6 F (37 C)   Resp 18   Ht 5' (1.524 m)   Wt 79.4 kg   SpO2 100%   BMI 34.18 kg/m  Physical Exam Vitals and nursing note reviewed.  Constitutional:      General: She is not in acute distress.    Appearance: Normal appearance. She is well-developed.  HENT:     Head: Normocephalic and atraumatic.     Nose: Congestion present.  Eyes:     Pupils: Pupils are equal, round, and reactive to light.  Neck:     Comments: No pain with displacement of the larynx, intact phonation Cardiovascular:     Rate and Rhythm: Normal rate and regular rhythm.     Pulses: Normal pulses.     Heart sounds: Normal heart sounds.  Pulmonary:  Effort: Pulmonary effort is normal. No respiratory distress.     Breath sounds: Normal breath sounds. No wheezing, rhonchi or rales.  Abdominal:     General: Bowel sounds are normal. There is no distension.     Palpations: Abdomen is soft.     Tenderness: There is no abdominal tenderness. There is no guarding or rebound.  Genitourinary:    Vagina: No vaginal discharge.  Musculoskeletal:        General: Normal range of motion.     Cervical back: Normal range of motion and neck supple.  Lymphadenopathy:     Cervical: No cervical adenopathy.  Skin:    General: Skin is warm and  dry.     Capillary Refill: Capillary refill takes less than 2 seconds.     Findings: No erythema or rash.  Neurological:     General: No focal deficit present.     Mental Status: She is alert and oriented to person, place, and time.     Deep Tendon Reflexes: Reflexes normal.  Psychiatric:        Mood and Affect: Mood normal.        Behavior: Behavior normal.     ED Results / Procedures / Treatments   Labs (all labs ordered are listed, but only abnormal results are displayed) Labs Reviewed  RESP PANEL BY RT-PCR (RSV, FLU A&B, COVID)  [id number]    EKG None  Radiology No results found.  Procedures Procedures    Medications Ordered in ED Medications  loratadine (CLARITIN) tablet 10 mg (10 mg Oral Given 08/13/22 0620)  benzonatate (TESSALON) capsule 200 mg (200 mg Oral Given 08/13/22 0620)  acetaminophen (TYLENOL) 160 MG/5ML solution 650 mg (650 mg Oral Given 08/13/22 9242)     ED Course/ Medical Decision Making/ A&P                             Medical Decision Making Patient with congestion cough, sore throat diarrhea and body aches   Amount and/or Complexity of Data Reviewed External Data Reviewed: notes.    Details: Previous notes reviewed  Labs: ordered.    Details: Negative covid flu and RSV are negative   Risk OTC drugs. Prescription drug management. Risk Details: Patient is very well appearing and normal exam and vital signs.  Lungs are clear. Symptoms are consistent with viral process    Final Clinical Impression(s) / ED Diagnoses Final diagnoses:  None   Return for intractable cough, coughing up blood, fevers > 100.4 unrelieved by medication, shortness of breath, intractable vomiting, chest pain, shortness of breath, weakness, numbness, changes in speech, facial asymmetry, abdominal pain, passing out, Inability to tolerate liquids or food, cough, altered mental status or any concerns. No signs of systemic illness or infection. The patient is  nontoxic-appearing on exam and vital signs are within normal limits.  I have reviewed the triage vital signs and the nursing notes. Pertinent labs & imaging results that were available during my care of the patient were reviewed by me and considered in my medical decision making (see chart for details). After history, exam, and medical workup I feel the patient has been appropriately medically screened and is safe for discharge home. Pertinent diagnoses were discussed with the patient. Patient was given return precautions.  Rx / DC Orders ED Discharge Orders     None         Asucena Galer, MD 08/13/22 [phone]

## 2022-08-18 ENCOUNTER — Encounter: Payer: Self-pay | Admitting: *Deleted

## 2022-09-01 ENCOUNTER — Encounter (HOSPITAL_BASED_OUTPATIENT_CLINIC_OR_DEPARTMENT_OTHER): Payer: Self-pay | Admitting: Emergency Medicine

## 2022-09-01 ENCOUNTER — Emergency Department (HOSPITAL_BASED_OUTPATIENT_CLINIC_OR_DEPARTMENT_OTHER): Payer: BC Managed Care – PPO

## 2022-09-01 ENCOUNTER — Other Ambulatory Visit: Payer: Self-pay

## 2022-09-01 ENCOUNTER — Emergency Department (HOSPITAL_BASED_OUTPATIENT_CLINIC_OR_DEPARTMENT_OTHER)
Admission: EM | Admit: 2022-09-01 | Discharge: 2022-09-01 | Disposition: A | Discharge status: Home / Self Care | Payer: BC Managed Care – PPO | Attending: Emergency Medicine | Admitting: Emergency Medicine

## 2022-09-01 DIAGNOSIS — R809 Proteinuria, unspecified: Secondary | ICD-10-CM | POA: Diagnosis not present

## 2022-09-01 DIAGNOSIS — R7401 Elevation of levels of liver transaminase levels: Secondary | ICD-10-CM | POA: Diagnosis not present

## 2022-09-01 DIAGNOSIS — K529 Noninfective gastroenteritis and colitis, unspecified: Secondary | ICD-10-CM | POA: Diagnosis not present

## 2022-09-01 DIAGNOSIS — R1031 Right lower quadrant pain: Secondary | ICD-10-CM | POA: Diagnosis present

## 2022-09-01 LAB — COMPREHENSIVE METABOLIC PANEL
ALT: 57 U/L — ABNORMAL HIGH (ref 0–44)
AST: 39 U/L (ref 15–41)
Albumin: 4.3 g/dL (ref 3.5–5.0)
Alkaline Phosphatase: 121 U/L (ref 38–126)
Anion gap: 12 (ref 5–15)
BUN: 12 mg/dL (ref 6–20)
CO2: 24 mmol/L (ref 22–32)
Calcium: 9.1 mg/dL (ref 8.9–10.3)
Chloride: 102 mmol/L (ref 98–111)
Creatinine, Ser: 0.83 mg/dL (ref 0.44–1.00)
GFR, Estimated: >60 mL/min (ref >60)
Glucose, Bld: 109 mg/dL — ABNORMAL HIGH (ref 70–99)
Potassium: 3.9 mmol/L (ref 3.5–5.1)
Sodium: 138 mmol/L (ref 135–145)
Total Bilirubin: 0.3 mg/dL (ref 0.3–1.2)
Total Protein: 8.5 g/dL — ABNORMAL HIGH (ref 6.5–8.1)

## 2022-09-01 LAB — URINALYSIS, ROUTINE W REFLEX MICROSCOPIC
Bilirubin Urine: NEGATIVE
Glucose, UA: NEGATIVE mg/dL
Hgb urine dipstick: NEGATIVE
Ketones, ur: NEGATIVE mg/dL
Leukocytes,Ua: NEGATIVE
Nitrite: NEGATIVE
Protein, ur: 100 mg/dL — AB
Specific Gravity, Urine: 1.015 (ref 1.005–1.030)
pH: 8.5 — ABNORMAL HIGH (ref 5.0–8.0)

## 2022-09-01 LAB — CBC
HCT: 44 % (ref 36.0–46.0)
Hemoglobin: 13.8 g/dL (ref 12.0–15.0)
MCH: 27.8 pg (ref 26.0–34.0)
MCHC: 31.4 g/dL (ref 30.0–36.0)
MCV: 88.7 fL (ref 80.0–100.0)
Platelets: 250 10*3/uL (ref 150–400)
RBC: 4.96 MIL/uL (ref 3.87–5.11)
RDW: 13.8 % (ref 11.5–15.5)
WBC: 6.5 10*3/uL (ref 4.0–10.5)
nRBC: 0 % (ref 0.0–0.2)

## 2022-09-01 LAB — URINALYSIS, MICROSCOPIC (REFLEX)

## 2022-09-01 LAB — LIPASE, BLOOD: Lipase: 34 U/L (ref 11–51)

## 2022-09-01 MED ORDER — SIMETHICONE 40 MG/0.6ML PO SUSP (UNIT DOSE)
40.0000 mg | Freq: Once | ORAL | Status: AC
  Administered 2022-09-01: 40 mg via ORAL
  Filled 2022-09-01: qty 0.6

## 2022-09-01 MED ORDER — IOHEXOL 300 MG/ML  SOLN
100.0000 mL | Freq: Once | INTRAMUSCULAR | Status: AC | PRN
  Administered 2022-09-01: 100 mL via INTRAVENOUS

## 2022-09-01 MED ORDER — AMOXICILLIN-POT CLAVULANATE 875-125 MG PO TABS: 1.0000 | ORAL_TABLET | Freq: Two times a day (BID) | ORAL | 0 refills | Status: AC

## 2022-09-01 MED ORDER — ONDANSETRON HCL 4 MG/2ML IJ SOLN
4.0000 mg | Freq: Once | INTRAMUSCULAR | Status: AC
  Administered 2022-09-01: 4 mg via INTRAVENOUS
  Filled 2022-09-01: qty 2

## 2022-09-01 MED ORDER — DICYCLOMINE HCL 20 MG PO TABS: 20.0000 mg | ORAL_TABLET | Freq: Two times a day (BID) | ORAL | 0 refills | Status: AC

## 2022-09-01 MED ORDER — SODIUM CHLORIDE 0.9 % IV BOLUS
1000.0000 mL | Freq: Once | INTRAVENOUS | Status: AC
  Administered 2022-09-01: 1000 mL via INTRAVENOUS

## 2022-09-01 MED ORDER — DICYCLOMINE HCL 10 MG PO CAPS
10.0000 mg | ORAL_CAPSULE | Freq: Once | ORAL | Status: AC
  Administered 2022-09-01: 10 mg via ORAL
  Filled 2022-09-01: qty 1

## 2022-09-01 MED ORDER — KETOROLAC TROMETHAMINE 15 MG/ML IJ SOLN
15.0000 mg | Freq: Once | INTRAMUSCULAR | Status: AC
  Administered 2022-09-01: 15 mg via INTRAVENOUS
  Filled 2022-09-01: qty 1

## 2022-09-01 MED ORDER — ONDANSETRON 4 MG PO TBDP: 4.0000 mg | ORAL_TABLET | Freq: Three times a day (TID) | ORAL | 0 refills | Status: AC | PRN

## 2022-09-01 NOTE — ED Triage Notes (Signed)
RLQ pain radiates to mid abdomen , NVD all startled today , no urinary symptoms , no Hx kidney stone . Hx hysterectomy , still have her right ovary .

## 2022-09-01 NOTE — ED Provider Notes (Signed)
[place] [facility] Provider Note   CSN: [id number] Arrival date & time: 09/01/22  1229     History  Chief Complaint  Patient presents with   Abdominal Pain    Nina Patel is a 52 y.o. female.  Patient with history of abdominal hysterectomy presents today with complaints of abdominal pain. She states that same is located in her RLQ and awoke her from sleep this morning. She has never felt pain like this before. She endorses associated nausea, vomiting, and diarrhea. Denies hematemesis, hematochezia, or melena. Last episode of vomiting was around 11 am this morning. No urinary symptoms. Denies any other history of abdominal surgeries. Does not that she had [place] food for dinner last night and her daughter has had an upset stomach today as well.  The history is provided by the patient. No language interpreter was used.  Abdominal Pain Associated symptoms: diarrhea, nausea and vomiting        Home Medications Prior to Admission medications   Medication Sig Start Date End Date Taking? Authorizing Provider  benzonatate (TESSALON) 100 MG capsule Take 1 capsule (100 mg total) by mouth every 8 (eight) hours. 08/13/22   Palumbo, April, MD  clotrimazole-betamethasone (LOTRISONE) cream Apply to affected area 2 times daily to rash under breasts until rash is gone. 10/30/21   Gilda Crease, MD  fluticasone (FLONASE) 50 MCG/ACT nasal spray Place 2 sprays into both nostrils daily for 7 days. 11/21/20 11/28/20  Long, Arlyss Repress, MD  hydrochlorothiazide (HYDRODIURIL) 25 MG tablet TK 1 T PO D 10/22/17   [provider]  HYDROcodone-acetaminophen (NORCO) 5-325 MG tablet Take 1 tablet by mouth every 6 (six) hours as needed for severe pain. 04/30/22   Molpus, John, MD  lisinopril (PRINIVIL,ZESTRIL) 5 MG tablet  01/17/18   [provider]  magic mouthwash (lidocaine, diphenhydrAMINE, alum & mag hydroxide) suspension Swish and spit 5 mLs 3  (three) times daily as needed for mouth pain. 08/13/22   Palumbo, April, MD  ondansetron (ZOFRAN-ODT) 4 MG disintegrating tablet 4mg  ODT q4 hours prn nausea/vomit 10/30/21   Pollina, Canary Brim, MD  polyethylene glycol (MIRALAX / GLYCOLAX) 17 g packet Take 17 g by mouth daily as needed for moderate constipation. 07/18/20   Benjiman Core, MD      Allergies    Sulfamethoxazole-trimethoprim, Morphine, Morphine and related, Strawberry extract, and Tramadol    Review of Systems   Review of Systems  Gastrointestinal:  Positive for abdominal pain, diarrhea, nausea and vomiting.  All other systems reviewed and are negative.   Physical Exam Updated Vital Signs BP 120/85   Pulse 91   Temp 98.8 F (37.1 C)   Resp 18   Wt 79.4 kg   SpO2 96%   BMI 34.18 kg/m  Physical Exam Vitals and nursing note reviewed.  Constitutional:      General: She is not in acute distress.    Appearance: Normal appearance. She is normal weight. She is not ill-appearing, toxic-appearing or diaphoretic.  HENT:     Head: Normocephalic and atraumatic.  Cardiovascular:     Rate and Rhythm: Normal rate.  Pulmonary:     Effort: Pulmonary effort is normal. No respiratory distress.  Abdominal:     General: Abdomen is flat.     Palpations: Abdomen is soft.     Tenderness: There is abdominal tenderness in the right lower quadrant.  Musculoskeletal:        General: Normal range of motion.  Cervical back: Normal range of motion.  Skin:    General: Skin is warm and dry.  Neurological:     General: No focal deficit present.     Mental Status: She is alert.  Psychiatric:        Mood and Affect: Mood normal.        Behavior: Behavior normal.     ED Results / Procedures / Treatments   Labs (all labs ordered are listed, but only abnormal results are displayed) Labs Reviewed  COMPREHENSIVE METABOLIC PANEL - Abnormal; Notable for the following components:      Result Value   Glucose, Bld 109 (*)    Total  Protein 8.5 (*)    ALT 57 (*)    All other components within normal limits  URINALYSIS, ROUTINE W REFLEX MICROSCOPIC - Abnormal; Notable for the following components:   pH 8.5 (*)    Protein, ur 100 (*)    All other components within normal limits  URINALYSIS, MICROSCOPIC (REFLEX) - Abnormal; Notable for the following components:   Bacteria, UA RARE (*)    All other components within normal limits  LIPASE, BLOOD  CBC    EKG None  Radiology No results found.  Procedures Procedures    Medications Ordered in ED Medications  sodium chloride 0.9 % bolus 1,000 mL (1,000 mLs Intravenous New Bag/Given 09/01/22 1332)  ondansetron (ZOFRAN) injection 4 mg (4 mg Intravenous Given 09/01/22 1332)  ketorolac (TORADOL) 15 MG/ML injection 15 mg (15 mg Intravenous Given 09/01/22 1333)  iohexol (OMNIPAQUE) 300 MG/ML solution 100 mL (100 mLs Intravenous Contrast Given 09/01/22 1429)    ED Course/ Medical Decision Making/ A&P                             Medical Decision Making Amount and/or Complexity of Data Reviewed Labs: ordered. Radiology: ordered.  Risk Prescription drug management.   This patient is a 52 y.o. female who presents to the ED for concern of abdominal pain, nausea, vomiting, diarrhea, this involves an extensive number of treatment options, and is a complaint that carries with it a high risk of complications and morbidity. The emergent differential diagnosis prior to evaluation includes, but is not limited to,  AAA, gastroenteritis, appendicitis, Bowel obstruction, Bowel perforation. Gastroparesis, DKA, Hernia, Inflammatory bowel disease, mesenteric ischemia, pancreatitis, peritonitis SBP, volvulus.  This is not an exhaustive differential.   Past Medical History / Co-morbidities / Social History: Hx hysterectomy   Physical Exam: Physical exam performed. The pertinent findings include: RLQ TTP  Lab Tests: I ordered, and personally interpreted labs.  The pertinent  results include:  ALT 57, proteinuria. No other acute laboratory findings   Imaging Studies: I ordered imaging studies including CT abdomen pelvis. I independently visualized and interpreted imaging which showed   1. Fluid-filled small and large bowel without bowel wall thickening or surrounding inflammation. Findings may represent enterocolitis. 2. Hepatic steatosis.  I agree with the radiologist interpretation.   Medications: I ordered medication including fluids, toradol, zofran  for dehydration, pain, nausea. Simethicone and bentyl for pain. Reevaluation of the patient after these medicines showed that the patient improved. I have reviewed the patients home medicines and have made adjustments as needed.   Disposition:  Patient is nontoxic, nonseptic appearing, in no apparent distress.  Patient's pain and other symptoms adequately managed in emergency department.  Fluid bolus given.  Labs, imaging and vitals reviewed.  Patient does not meet the SIRS  or Sepsis criteria.  On repeat exam patient does not have a surgical abdomin and there are no peritoneal signs.  No indication of appendicitis, bowel obstruction, bowel perforation, cholecystitis, diverticulitis, PID or ectopic pregnancy.  CT shows enterocolitis, symptoms consistent with same. She does not have any risk factors for cdiff and has not had any episodes of diarrhea since arriving here today. Upon reassessment, patient states she feels well enough to go home. She is able to eat and drink without any subsequent episodes of nausea or vomiting. Patient discharged home with zofran, Augmentin, and Bentyl and given strict instructions for follow-up with their primary care physician.  I have also discussed reasons to return immediately to the ER.  Patient expresses understanding and agrees with plan. Patient discharged in stable condition.   Final Clinical Impression(s) / ED Diagnoses Final diagnoses:  Enterocolitis    Rx / DC Orders ED  Discharge Orders          Ordered    ondansetron (ZOFRAN-ODT) 4 MG disintegrating tablet  Every 8 hours PRN        09/01/22 1548    dicyclomine (BENTYL) 20 MG tablet  2 times daily        09/01/22 1548    amoxicillin-clavulanate (AUGMENTIN) 875-125 MG tablet  Every 12 hours        09/01/22 1548          An After Visit Summary was printed and given to the patient.     Vear Clock 09/01/22 1550    Rondel Baton, MD 09/01/22 [phone]

## 2022-09-01 NOTE — Discharge Instructions (Signed)
As we discussed, your CT scan revealed signs of enterocolitis.  This is basically just inflammation in the walls of your intestine from likely either a virus or bacterial cause.  I have given you a prescription for Augmentin which is an antibiotic for you to take as prescribed in its entirety to cover for any bacterial cause of the symptoms.  I have also given you a prescription for Zofran for you to take as needed for nausea/vomiting.  Additionally I have given you a prescription for Bentyl which is an antispasmodic agent to help with discomfort.  He may also take Tylenol/ibuprofen as needed.  Please do not take any antidiarrheal agents in the short-term to help ensure that your body clears the infection.  Please schedule a follow-up with your primary care doctor for follow-up evaluation of the symptoms.  You may also need to schedule a screening colonoscopy with a GI doctor if you have not had one yet.  Return if development of any new or worsening symptoms.

## 2022-09-01 NOTE — ED Notes (Signed)
Up to go to [facility] ambulatory by self

## 2022-12-01 IMAGING — CT CT ABD-PELV W/ CM
2 of 5 series · 16 of 46 positions shown, 18 images · IV contrast (Omnipaque)
Comparison: CT abdomen pelvis 09/22/2016, CT abdomen pelvis
04/22/2019

CLINICAL DATA: Abdominal pain, acute, nonlocalized

EXAM:
CT ABDOMEN AND PELVIS WITH CONTRAST
TECHNIQUE: Multidetector CT imaging of the abdomen and pelvis was performed
using the standard protocol following bolus administration of
intravenous contrast.

[Series 2: axial st · axial · 0.73mm/px · z∈[-359,+26]mm · 13 of 87 slices shown, 15 images]
[im 5/87  soft-tissue]
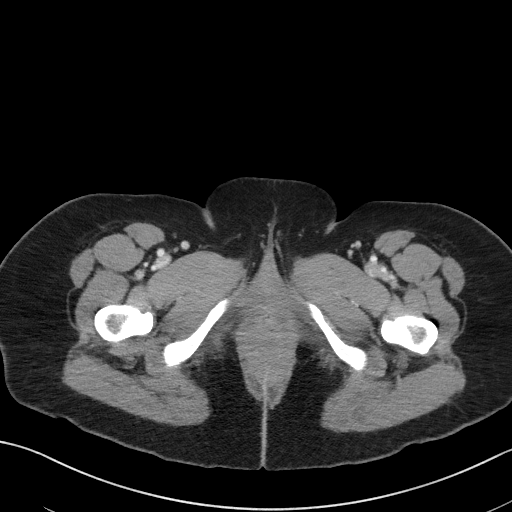
[im 5/87  bone]
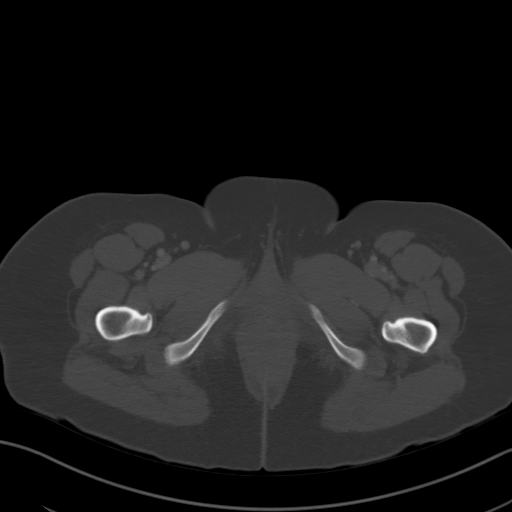
[im 14/87  soft-tissue]
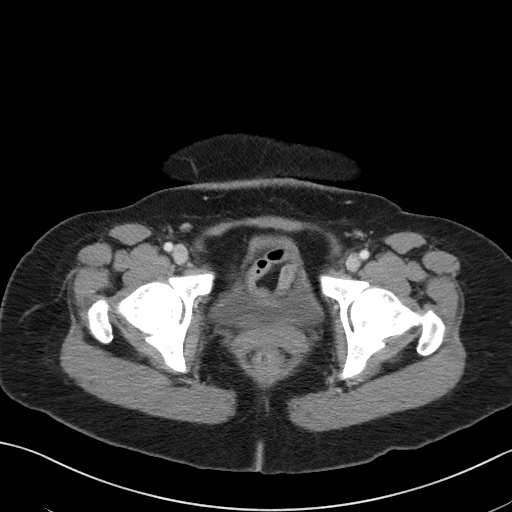
[im 19/87  soft-tissue]
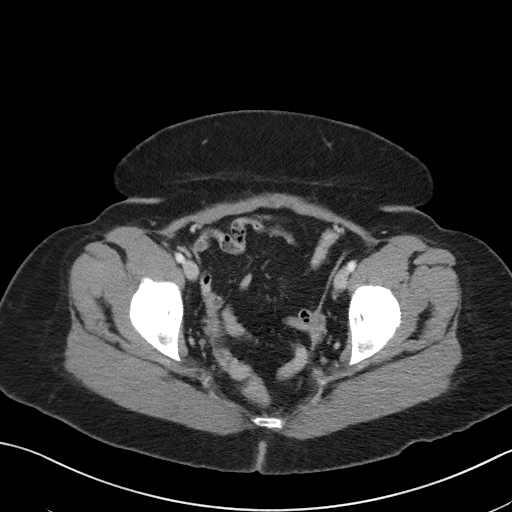
[im 23/87  soft-tissue]
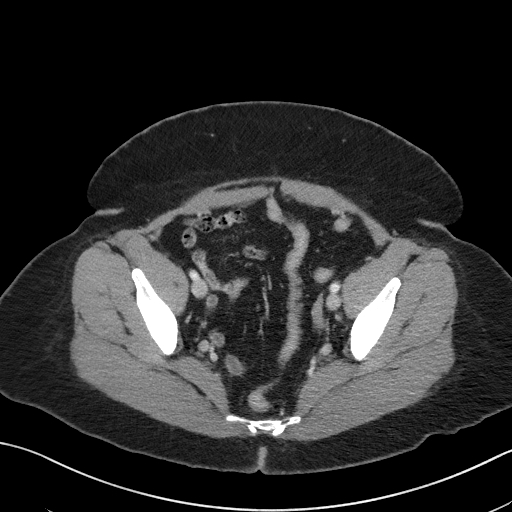
[im 32/87  soft-tissue]
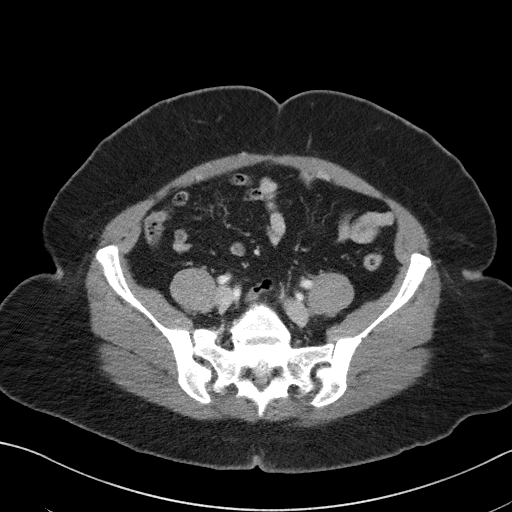
[im 37/87  soft-tissue]
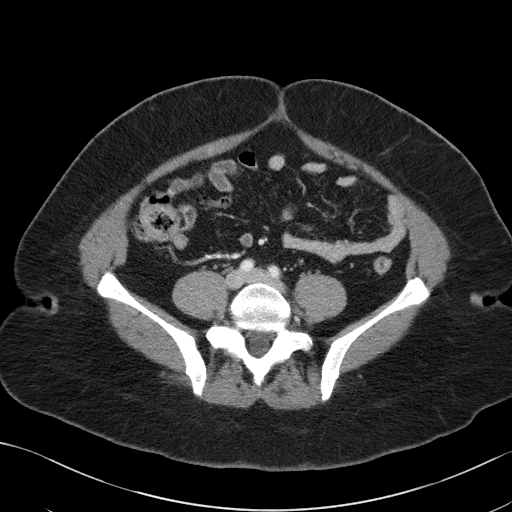
[im 46/87  soft-tissue]
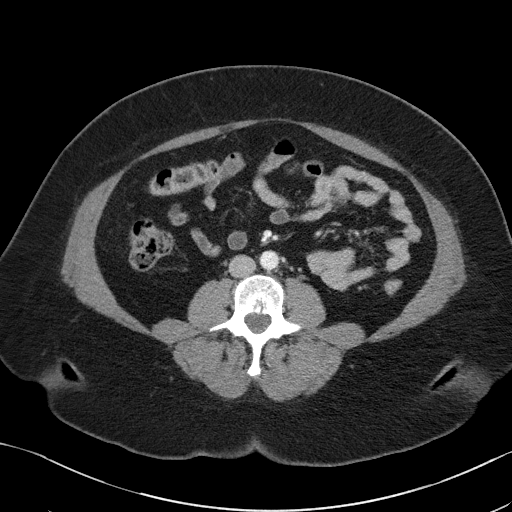
[im 50/87  soft-tissue]
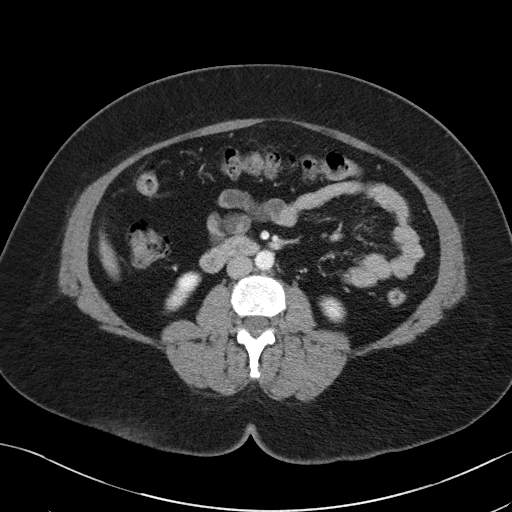
[im 55/87  soft-tissue]
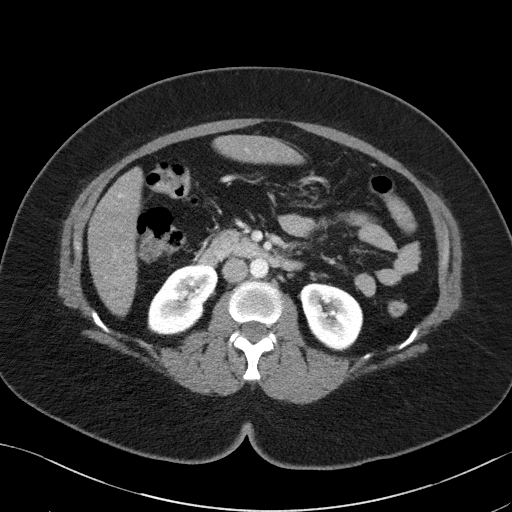
[im 55/87  bone]
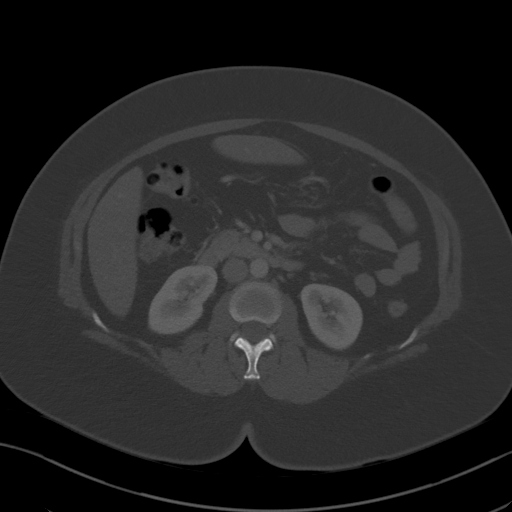
[im 64/87  soft-tissue]
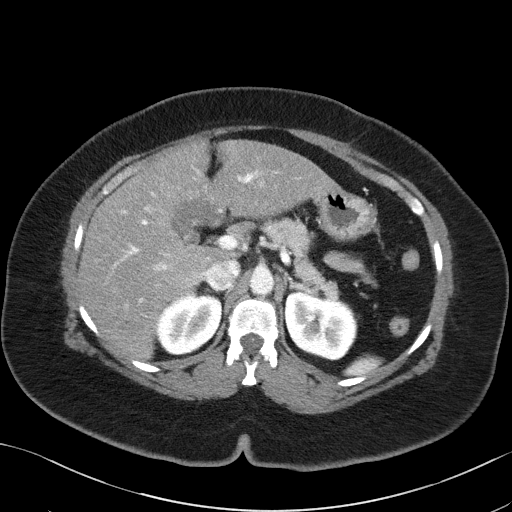
[im 68/87  soft-tissue]
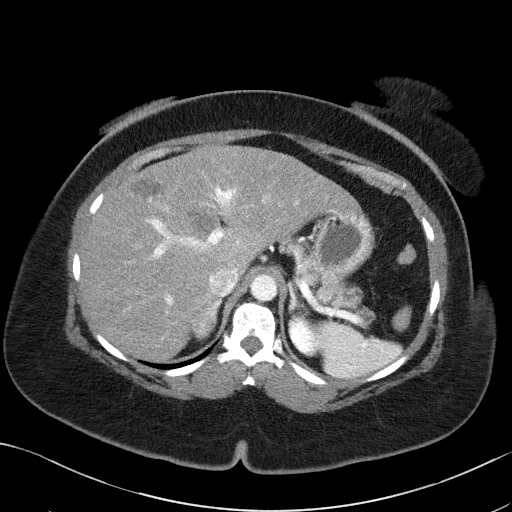
[im 73/87  soft-tissue]
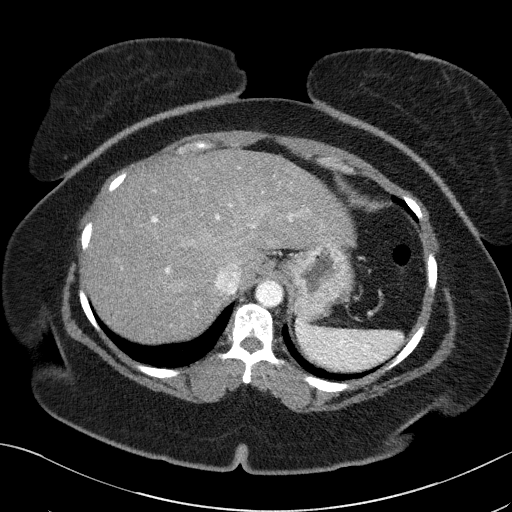
[im 82/87  soft-tissue]
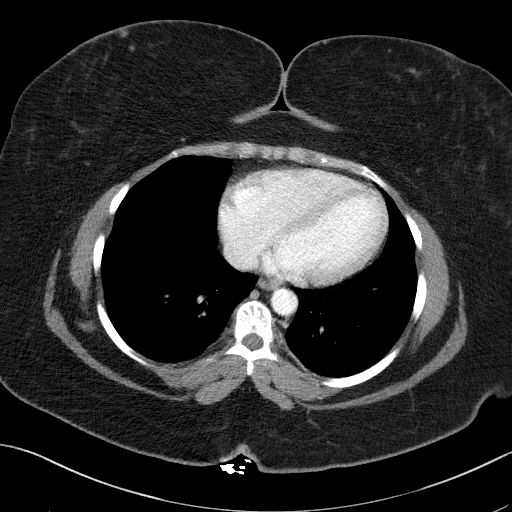

[Series 5: coronal st · coronal · 0.86mm/px · 3 of 100 slices shown]
[im 34/100  soft-tissue]
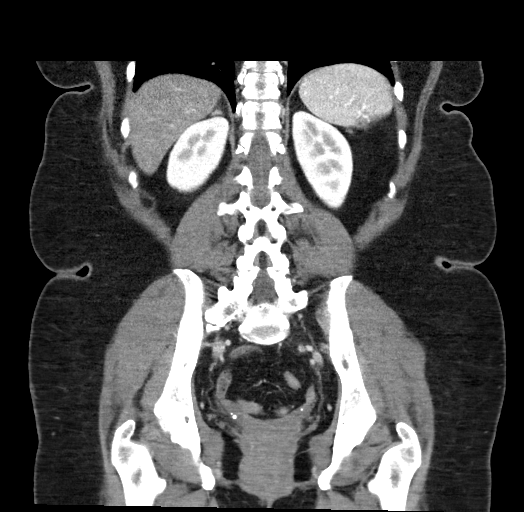
[im 45/100  soft-tissue]
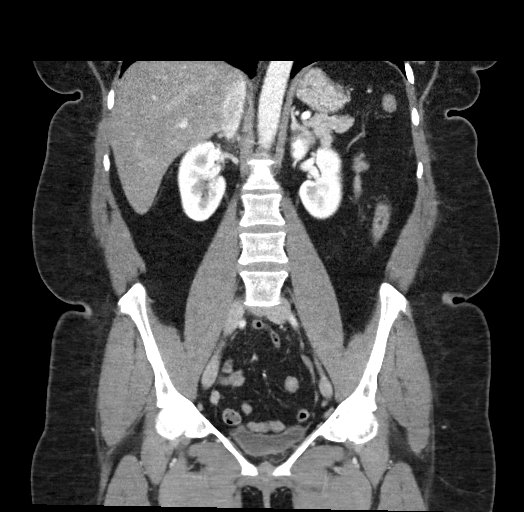
[im 56/100  soft-tissue]
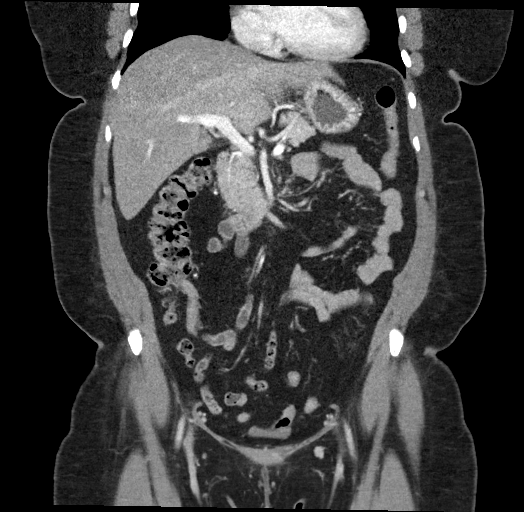

[16 of 46 positions shown; findings below may reference images not displayed]

RADIATION DOSE REDUCTION: This exam was performed according to the
departmental dose-optimization program which includes automated
exposure control, adjustment of the mA and/or kV according to
patient size and/or use of iterative reconstruction technique.

CONTRAST:  100mL OMNIPAQUE IOHEXOL 300 MG/ML  SOLN
FINDINGS: Lower chest: No acute abnormality. There is a 6 mm nodule in the
outer right breast unchanged from prior exams dating back to September 2016 (series 2, image 3), which should be correlated with
mammography.

Hepatobiliary: Unchanged central hypodense liver lesions with
peripheral discontinuous nodular enhancement and progressive
peripheral filling on the delay. These are favored to represent
hemangiomas. The gallbladder is unremarkable.

Pancreas: Unremarkable. No pancreatic ductal dilatation or
surrounding inflammatory changes.

Spleen: Normal in size without focal abnormality.

Adrenals/Urinary Tract: Adrenal glands are unremarkable. No
hydronephrosis or nephrolithiasis. The bladder is minimally
distended.

Stomach/Bowel: The stomach is within normal limits. There is no
evidence of bowel obstruction.The appendix is normal.

Vascular/Lymphatic: No significant vascular findings are present. No
enlarged abdominal or pelvic lymph nodes.

Reproductive: Prior hysterectomy.

Other: Diastasis recti. No bowel containing hernia. There is a fat
containing hernia with fat herniating between the right rectus
abdominus and the abdominal wall. No ascites. No free air.

Musculoskeletal: No acute osseous abnormality. No suspicious lytic
or blastic lesions. Multilevel degenerative changes of the spine.
Mild bilateral hip osteoarthritis.
IMPRESSION: No acute abdominopelvic abnormality.  Normal appendix.

Unchanged hemangiomas in the liver.

## 2023-01-17 NOTE — ED Provider Notes (Signed)
 [facility] Emergency Department Emergency Department Provider Note  This document was created using the aid of voice recognition Dragon dictation software  ____________________________________________  Time seen: 3:56 AM  I have reviewed the triage vital signs and the nursing notes.   History   Chief Complaint Flank Pain   HPI  Nina Patel is a 52 y.o. female who presents with right flank and rib pain. Pain began two days ago. The pain began as a cramping type pain. The pain has worsened over the past two days. The pain is now constant. Pain is worsened when the patient turns to the right side. Associated with nausea. No shortness of breath. No cough and no fever. The patient reports a history of migraines and hypertension. The patient takes Lisinopril  every morning 10 mg and is compliant and took the dose this morning. The patient did not take her 25 mg of hydrochlorothiazide this morning. History of partial hysterectomy. Non smoker.  No injury. No travel. No dysuria, no hematuria.   Physical Exam   VITAL SIGNS:   ED Triage Vitals [01/17/23 0016]  Temp 98.2 F (36.8 C)  Heart Rate 77  Resp 18  BP (!) 182/116  MAP (mmHg) 138  SpO2 98 %  O2 Device None (Room air)  O2 Flow Rate (L/min)   Weight 84.7 kg (186 lb 12.8 oz)    Constitutional: Alert and oriented. Well appearing and in no distress. Eyes: Conjunctivae are normal. ENT      Head: Normocephalic and atraumatic.      Neck: No stridor. Cardiovascular: Normal rate and rhythm. Heart rate: 71. Blood pressure 153/82. Respiratory: Normal respiratory effort. Breath sounds are normal. Oxygen saturation 98% on room air. Gastrointestinal: Soft and nontender. Negative Murphy sign. Mild RUQ abdominal tenderness to palpation. Negative CVA tenderness to percussion.  No rash to the right flank to suggest herpes zoster. Musculoskeletal: No deformities noted. No leg swelling. Right ribs display tenderness to  palpation. Neurologic: Normal speech and language. No gross focal neurologic deficits are appreciated. Psychiatric: Mood and affect are normal.   EKG   None  Radiology   All X-rays, CTs, and MRIs interpreted by radiologist and interpretation reviewed by me.  Procedures   Procedure(s) performed: None.  Pertinent labs & imaging results that were available during my care of the patient were reviewed by me and considered in my medical decision making (see chart for details).  Total critical care time was 0 minutes. This was spent providing cardiovascular or respiratory resuscitation in a critically ill patient.    ED Clinical Impression   1. Right flank pain Active  2. Elevated serum creatinine Active    Medical Decision Making: Initial Impression, ED Course, Assessment and Plan    Medical Decision Making 52 year old female presents with flank pain. Differential diagnosis included pneumothorax, nephrolithiasis/ureterolithiasis, pyelonephritis, renal mass/malignancy, rib fracture, herpes zoster, musculoskeletal strain, abdominal aortic dissection/aneurysm, acute pulmonary embolism. CT chest abdomen and pelvis considered and was deferred as the patient declined all CT testing.  The initial plan was to perform CTA chest and CT abdomen pelvis with contrast.  The patient declined this testing.  Therefore I ordered a chest x-ray and a right rib x-ray with a D-dimer test and a urinalysis. No rash to the right flank to suggest herpes zoster. Diagnostic workup included labs, CXR, urinalysis.  The workup here is negative.  The serum creatinine is slightly higher than usual.  This is likely clinically insignificant.  The patient was  given a bolus of IV fluids.  The patient is requesting acetaminophen  for pain.  The patient strongly thinks that she has musculoskeletal right flank pain.  Certainly many of the historical factors and the tenderness to palpation suggest a musculoskeletal cause.  All  workup including D-dimer right rib x-ray chest x-ray and urinalysis as well as labs including liver enzymes bilirubin and lipase are all unremarkable.  The patient is requesting to be discharged.  I have discussed with the patient that we did not perform a complete evaluation as we did not CT of the chest or the abdomen with contrast.  The patient understands if she has worsening of symptoms or new symptoms develop to return to the emergency department to complete her evaluation here.  Given the mild elevation in creatinine we will hold on NSAIDs the patient will be given acetaminophen  and as needed cyclobenzaprine  at home.  Follow-up with primary care.   Problems Addressed: Elevated serum creatinine: complicated acute illness or injury Right flank pain: complicated acute illness or injury  Amount and/or Complexity of Data Reviewed Labs: ordered. Radiology: ordered.  Risk OTC drugs. Prescription drug management.    Clinical Complexity  Patient's presentation is most consistent with acute presentation with potential threat to life or bodily function.  Patient's history of HTN increases the complexity of managing their  presentation with right sided rib/flank pain.    Provider time spent in patient care today, inclusive of but not limited to clinical reassessment, review of diagnostic studies, and discharge preparation, was greater than 30 minutes.

## 2024-05-07 ENCOUNTER — Emergency Department (HOSPITAL_BASED_OUTPATIENT_CLINIC_OR_DEPARTMENT_OTHER)
Admission: EM | Admit: 2024-05-07 | Discharge: 2024-05-07 | Disposition: A | Discharge status: Home / Self Care | Attending: Emergency Medicine | Admitting: Emergency Medicine

## 2024-05-07 ENCOUNTER — Other Ambulatory Visit: Payer: Self-pay

## 2024-05-07 ENCOUNTER — Encounter (HOSPITAL_BASED_OUTPATIENT_CLINIC_OR_DEPARTMENT_OTHER): Payer: Self-pay | Admitting: Emergency Medicine

## 2024-05-07 DIAGNOSIS — R519 Headache, unspecified: Secondary | ICD-10-CM | POA: Diagnosis present

## 2024-05-07 DIAGNOSIS — Z5321 Procedure and treatment not carried out due to patient leaving prior to being seen by health care provider: Secondary | ICD-10-CM

## 2024-05-07 DIAGNOSIS — Z5329 Procedure and treatment not carried out because of patient's decision for other reasons: Secondary | ICD-10-CM | POA: Diagnosis not present

## 2024-05-07 DIAGNOSIS — Z79899 Other long term (current) drug therapy: Secondary | ICD-10-CM | POA: Diagnosis not present

## 2024-05-07 DIAGNOSIS — H538 Other visual disturbances: Secondary | ICD-10-CM | POA: Diagnosis not present

## 2024-05-07 DIAGNOSIS — I10 Essential (primary) hypertension: Secondary | ICD-10-CM | POA: Diagnosis not present

## 2024-05-07 MED ORDER — LACTATED RINGERS IV BOLUS
500.0000 mL | Freq: Once | INTRAVENOUS | Status: AC
  Administered 2024-05-07: 500 mL via INTRAVENOUS

## 2024-05-07 MED ORDER — LISINOPRIL 10 MG PO TABS
5.0000 mg | ORAL_TABLET | Freq: Once | ORAL | Status: AC
  Administered 2024-05-07: 5 mg via ORAL
  Filled 2024-05-07: qty 1

## 2024-05-07 MED ORDER — KETOROLAC TROMETHAMINE 15 MG/ML IJ SOLN
15.0000 mg | Freq: Once | INTRAMUSCULAR | Status: AC
  Administered 2024-05-07: 15 mg via INTRAVENOUS
  Filled 2024-05-07: qty 1

## 2024-05-07 MED ORDER — DEXAMETHASONE SOD PHOSPHATE PF 10 MG/ML IJ SOLN
10.0000 mg | Freq: Once | INTRAMUSCULAR | Status: AC
  Administered 2024-05-07: 10 mg via INTRAVENOUS

## 2024-05-07 MED ORDER — METOCLOPRAMIDE HCL 5 MG/ML IJ SOLN
10.0000 mg | Freq: Once | INTRAMUSCULAR | Status: AC
  Administered 2024-05-07: 10 mg via INTRAVENOUS
  Filled 2024-05-07: qty 2

## 2024-05-07 NOTE — ED Provider Notes (Signed)
 "  EMERGENCY DEPARTMENT AT [facility] Provider Note   CSN: [id number] Arrival date & time: 05/07/24  1628     Patient presents with: Headache   Nina Patel is a 54 y.o. female.   Headache  Patient is a 54 year old female to the ED today concerns for right-sided headache rating down right neck, noted to be progressively worse over the course the last 3 days, similar to her previous migraine headaches with intermittent blurry vision, pulsatile sensation with light and sound sensitivity.  Notably has been out of her blood pressure medication for the last week, taking lisinopril .  Endorses nausea.  Previous medical history of migraine, HTN, lumbar radiculopathy  Denies numbness, weakness, tingling, tinnitus, dysphagia, odynophagia, vertigo, chest pain, shortness of breath, abdominal pain, vomiting, lower leg swelling.    Prior to Admission medications  Medication Sig Start Date End Date Taking? Authorizing Provider  amoxicillin -clavulanate (AUGMENTIN ) 875-125 MG tablet Take 1 tablet by mouth every 12 (twelve) hours. 09/01/22   Smoot, Lauraine LABOR, PA-C  benzonatate  (TESSALON ) 100 MG capsule Take 1 capsule (100 mg total) by mouth every 8 (eight) hours. 08/13/22   Palumbo, April, MD  clotrimazole -betamethasone  (LOTRISONE ) cream Apply to affected area 2 times daily to rash under breasts until rash is gone. 10/30/21   Haze Lonni PARAS, MD  dicyclomine  (BENTYL ) 20 MG tablet Take 1 tablet (20 mg total) by mouth 2 (two) times daily. 09/01/22   Smoot, Sarah A, PA-C  fluticasone  (FLONASE ) 50 MCG/ACT nasal spray Place 2 sprays into both nostrils daily for 7 days. 11/21/20 11/28/20  Long, Joshua G, MD  hydrochlorothiazide (HYDRODIURIL) 25 MG tablet TK 1 T PO D 10/22/17   [provider]  HYDROcodone -acetaminophen  (NORCO) 5-325 MG tablet Take 1 tablet by mouth every 6 (six) hours as needed for severe pain. 04/30/22   Molpus, John, MD  lisinopril  (PRINIVIL ,ZESTRIL ) 5 MG tablet   01/17/18   [provider]  magic mouthwash (lidocaine , diphenhydrAMINE , alum & mag hydroxide) suspension Swish and spit 5 mLs 3 (three) times daily as needed for mouth pain. 08/13/22   Palumbo, April, MD  ondansetron  (ZOFRAN -ODT) 4 MG disintegrating tablet Take 1 tablet (4 mg total) by mouth every 8 (eight) hours as needed for nausea or vomiting. 09/01/22   Smoot, Lauraine LABOR, PA-C  polyethylene glycol (MIRALAX  / GLYCOLAX ) 17 g packet Take 17 g by mouth daily as needed for moderate constipation. 07/18/20   Patsey Lot, MD    Allergies: Sulfamethoxazole-trimethoprim, Morphine , Morphine  and codeine, Strawberry extract, and Tramadol     Review of Systems  Neurological:  Positive for headaches.  All other systems reviewed and are negative.   Updated Vital Signs BP (!) 145/90   Pulse 86   Temp 98.2 F (36.8 C) (Oral)   Resp 16   Ht 5' (1.524 m)   Wt 81.6 kg   SpO2 100%   BMI 35.15 kg/m   Physical Exam Vitals and nursing note reviewed.  Constitutional:      General: She is not in acute distress.    Appearance: Normal appearance. She is not ill-appearing or diaphoretic.  HENT:     Head: Normocephalic and atraumatic.     Right Ear: Tympanic membrane, ear canal and external ear normal.     Left Ear: Tympanic membrane, ear canal and external ear normal.     Mouth/Throat:     Mouth: Mucous membranes are moist.     Pharynx: Oropharynx is clear. No oropharyngeal exudate or posterior oropharyngeal erythema.  Eyes:     General: No scleral icterus.       Right eye: No discharge.        Left eye: No discharge.     Extraocular Movements: Extraocular movements intact.     Conjunctiva/sclera: Conjunctivae normal.     Pupils: Pupils are equal, round, and reactive to light.  Cardiovascular:     Rate and Rhythm: Normal rate and regular rhythm.     Pulses: Normal pulses.     Heart sounds: Normal heart sounds. No murmur heard.    No friction rub. No gallop.  Pulmonary:     Effort:  Pulmonary effort is normal. No respiratory distress.     Breath sounds: No stridor. No wheezing, rhonchi or rales.  Chest:     Chest wall: No tenderness.  Abdominal:     General: Abdomen is flat. There is no distension.     Palpations: Abdomen is soft.     Tenderness: There is no abdominal tenderness. There is no right CVA tenderness, left CVA tenderness, guarding or rebound.  Musculoskeletal:        General: No swelling, deformity or signs of injury.     Cervical back: Normal range of motion. No rigidity or tenderness.     Right lower leg: No edema.     Left lower leg: No edema.  Skin:    General: Skin is warm and dry.     Findings: No bruising, erythema, lesion or rash.  Neurological:     General: No focal deficit present.     Mental Status: She is alert and oriented to person, place, and time. Mental status is at baseline.     Sensory: No sensory deficit.     Motor: No weakness.     Comments: No facial asymmetry, no ataxia, no apraxia, no aphasia, no arm drift, normal coordination with finger-to-nose, normal sensation to both upper and lower extremities bilaterally, normal grip strength bilaterally, normal strength to both flexion and extension to both upper lower extremities 5+ bilaterally, no visual field deficits, no nystagmus.   Psychiatric:        Mood and Affect: Mood normal.     (all labs ordered are listed, but only abnormal results are displayed) Labs Reviewed - No data to display  EKG: None  Radiology: No results found.   Procedures   Medications Ordered in the ED  lactated ringers  bolus 500 mL (0 mLs Intravenous Stopped 05/07/24 1907)  metoCLOPramide  (REGLAN ) injection 10 mg (10 mg Intravenous Given 05/07/24 1850)  dexamethasone  (DECADRON ) injection 10 mg (10 mg Intravenous Given 05/07/24 1852)  ketorolac  (TORADOL ) 15 MG/ML injection 15 mg (15 mg Intravenous Given 05/07/24 1854)  lisinopril  (ZESTRIL ) tablet 5 mg (5 mg Oral Given 05/07/24 1855)    Medical Decision  Making Risk Prescription drug management.   This patient is a 54 year old female who presents to the ED for concern of headache is ongoing x 3 days, progressive, similar to previous migraines, rating down right side neck, pulsatile and with light and sound sensitivity.  On physical exam, patient is in no acute distress, afebrile, alert and orient x 4, speaking in full sentences, nontachypneic, nontachycardic.  Normal neuroexam, LCTAB, RRR, no murmur..  Ordered migraine cocktail, however patient eloped before being further evaluated and medication provided.  Due to patient saying that her dad was being watched the hospital. Eloped before being seen again for further evaluation.  Differential diagnoses prior to evaluation: The emergent differential diagnosis includes, but is not limited  to, tension headache, migraine, polypharmacy, substance abuse, sinusitis, cervicogenic headache, dehydration, cluster headache, trigeminal neuralgia, IIH, PRES syndrome, intracranial bleed, CVA. This is not an exhaustive differential.   Past Medical History / Co-morbidities / Social History: Migraine, HTN  Additional history: Chart reviewed. Pertinent results include: Last seen in Emergency Department 01/17/2023 for right flank pain.  Medications: I ordered medication including Toradol , lisinopril , Decadron , Reglan .  I have reviewed the patients home medicines and have made adjustments as needed.  Critical Interventions: None  Social Determinants of Health: None  Disposition: Patient eloped before being treated and further evaluated.  Final diagnoses:  Bad headache  Eloped from emergency department    ED Discharge Orders     None          Beola Terrall RAMAN, [place] 05/07/24 1917  "

## 2024-05-07 NOTE — ED Notes (Signed)
 Patient got a phone call stating her father was being rushed to the hospital and she needed to leave ASAP.  Patients IV was removed and she signed the AMA form and was told if she had any more concerns/symptoms she should feel free to return to the ER.  MD was made aware after she left as he was in a room at the time.

## 2024-05-07 NOTE — ED Triage Notes (Signed)
 Pt c/o HA x 3 days; sts she feels pain down RT side neck, shoulder and arm (also x 3 days); hx of migraines; out of BP meds x 1 wk
